# Patient Record
Sex: Male | Born: 1985 | Race: White | Hispanic: No | Marital: Single | Smoking: Current every day smoker
Health system: Southern US, Community
[De-identification: ages and names within clinical notes are randomized; demographics above are authoritative.]

## PROBLEM LIST (undated history)

## (undated) DIAGNOSIS — K8012 Calculus of gallbladder with acute and chronic cholecystitis without obstruction: Secondary | ICD-10-CM

## (undated) HISTORY — DX: Calculus of gallbladder with acute and chronic cholecystitis without obstruction: K80.12

---

## 2018-03-12 ENCOUNTER — Encounter: Payer: Self-pay | Admitting: Emergency Medicine

## 2018-03-12 ENCOUNTER — Other Ambulatory Visit: Payer: Self-pay

## 2018-03-12 ENCOUNTER — Emergency Department
Admission: EM | Admit: 2018-03-12 | Discharge: 2018-03-12 | Disposition: A | Discharge status: Home / Self Care | Payer: Self-pay | Attending: Emergency Medicine | Admitting: Emergency Medicine

## 2018-03-12 ENCOUNTER — Emergency Department: Payer: Self-pay

## 2018-03-12 DIAGNOSIS — F17210 Nicotine dependence, cigarettes, uncomplicated: Secondary | ICD-10-CM | POA: Insufficient documentation

## 2018-03-12 DIAGNOSIS — K529 Noninfective gastroenteritis and colitis, unspecified: Secondary | ICD-10-CM

## 2018-03-12 DIAGNOSIS — K5289 Other specified noninfective gastroenteritis and colitis: Secondary | ICD-10-CM | POA: Insufficient documentation

## 2018-03-12 DIAGNOSIS — Z79899 Other long term (current) drug therapy: Secondary | ICD-10-CM | POA: Insufficient documentation

## 2018-03-12 LAB — COMPREHENSIVE METABOLIC PANEL
ALT: 17 U/L (ref 17–63)
ANION GAP: 9 (ref 5–15)
AST: 25 U/L (ref 15–41)
Albumin: 4.4 g/dL (ref 3.5–5.0)
Alkaline Phosphatase: 53 U/L (ref 38–126)
BUN: 11 mg/dL (ref 6–20)
CHLORIDE: 106 mmol/L (ref 101–111)
CO2: 21 mmol/L — ABNORMAL LOW (ref 22–32)
Calcium: 8.9 mg/dL (ref 8.9–10.3)
Creatinine, Ser: 0.96 mg/dL (ref 0.61–1.24)
GFR calc Af Amer: >60 mL/min (ref >60)
GFR calc non Af Amer: >60 mL/min (ref >60)
Glucose, Bld: 105 mg/dL — ABNORMAL HIGH (ref 65–99)
POTASSIUM: 3.6 mmol/L (ref 3.5–5.1)
Sodium: 136 mmol/L (ref 135–145)
Total Bilirubin: 0.7 mg/dL (ref 0.3–1.2)
Total Protein: 7.3 g/dL (ref 6.5–8.1)

## 2018-03-12 LAB — CBC
HEMATOCRIT: 45.5 % (ref 40.0–52.0)
Hemoglobin: 15.8 g/dL (ref 13.0–18.0)
MCH: 31.6 pg (ref 26.0–34.0)
MCHC: 34.8 g/dL (ref 32.0–36.0)
MCV: 90.9 fL (ref 80.0–100.0)
PLATELETS: 193 10*3/uL (ref 150–440)
RBC: 5.01 MIL/uL (ref 4.40–5.90)
RDW: 12.4 % (ref 11.5–14.5)
WBC: 6.2 10*3/uL (ref 3.8–10.6)

## 2018-03-12 LAB — INFLUENZA PANEL BY PCR (TYPE A & B)
INFLAPCR: NEGATIVE
INFLBPCR: NEGATIVE

## 2018-03-12 LAB — LIPASE, BLOOD: LIPASE: 26 U/L (ref 11–51)

## 2018-03-12 MED ORDER — ONDANSETRON 4 MG PO TBDP: 4.0000 mg | ORAL_TABLET | Freq: Three times a day (TID) | ORAL | 0 refills | Status: DC | PRN

## 2018-03-12 MED ORDER — SODIUM CHLORIDE 0.9 % IV SOLN
1000.0000 mL | Freq: Once | INTRAVENOUS | Status: AC
  Administered 2018-03-12: 1000 mL via INTRAVENOUS

## 2018-03-12 MED ORDER — ONDANSETRON HCL 4 MG/2ML IJ SOLN
4.0000 mg | Freq: Once | INTRAMUSCULAR | Status: AC
  Administered 2018-03-12: 4 mg via INTRAVENOUS
  Filled 2018-03-12: qty 2

## 2018-03-12 NOTE — ED Provider Notes (Signed)
[facility] Emergency Department Provider Note   ____________________________________________    I have reviewed the triage vital signs and the nursing notes.   HISTORY  Chief Complaint Emesis; Cough; and Hematuria     HPI Nicholas Crane is a 32 y.o. male who presents with nausea vomiting and diarrhea as well as abdominal cramps over the last 3-4 days.  Patient reports family has been sick and he has "now caught it ".  Is not sure if he has had any fevers.  Inability to tolerate p.o.'s.  Intermittent diffuse abdominal cramping.  No recent travel.  No camping.  Has not taken anything for this.  Feels dehydrated.   History reviewed. No pertinent past medical history.  There are no active problems to display for this patient.   History reviewed. No pertinent surgical history.  Prior to Admission medications   Medication Sig Start Date End Date Taking? Authorizing Provider  ibuprofen (ADVIL,MOTRIN) 200 MG tablet Take 200-400 mg by mouth every 6 (six) hours as needed.   Yes [provider]  ondansetron (ZOFRAN ODT) 4 MG disintegrating tablet Take 1 tablet (4 mg total) by mouth every 8 (eight) hours as needed for nausea or vomiting. 03/12/18   Jene EveryKinner, Kelley Knoth, MD     Allergies Patient has no known allergies.  No family history on file.  Social History Social History   Tobacco Use  . Smoking status: Current Every Day Smoker    Packs/day: 0.50    Types: Cigarettes  . Smokeless tobacco: Never Used  Substance Use Topics  . Alcohol use: Yes    Comment: occase.  . Drug use: No    Review of Systems  Constitutional: No fever/chills Eyes: No visual changes.  ENT: No sore throat. Cardiovascular: Denies chest pain. Respiratory: Denies shortness of breath.  Positive cough Gastrointestinal: As above Genitourinary: Negative for dysuria. Musculoskeletal: Positive myalgias Skin: Negative for rash. Neurological: Negative for  headaches   ____________________________________________   PHYSICAL EXAM:  VITAL SIGNS: ED Triage Vitals  Enc Vitals Group     BP 03/12/18 0827 118/86     Pulse Rate 03/12/18 0827 68     Resp 03/12/18 0827 18     Temp 03/12/18 0827 99 F (37.2 C)     Temp Source 03/12/18 0827 Oral     SpO2 03/12/18 0845 98 %     Weight 03/12/18 0827 93 kg (205 lb)     Height 03/12/18 0827 1.905 m (6\' 3" )     Head Circumference --      Peak Flow --      Pain Score --      Pain Loc --      Pain Edu? --      Excl. in GC? --     Constitutional: Alert and oriented.  Pleasant and interactive Eyes: Conjunctivae are normal.   Nose: No congestion/rhinnorhea. Mouth/Throat: Mucous membranes are moist.    Cardiovascular: Normal rate, regular rhythm. Grossly normal heart sounds.  Good peripheral circulation. Respiratory: Normal respiratory effort.  No retractions. Lungs CTAB. Gastrointestinal: Soft and nontender. No distention.  No CVA tenderness. Genitourinary: deferred Musculoskeletal:   Warm and well perfused Neurologic:  Normal speech and language. No gross focal neurologic deficits are appreciated.  Skin:  Skin is warm, dry and intact. No rash noted. Psychiatric: Mood and affect are normal. Speech and behavior are normal.  ____________________________________________   LABS (all labs ordered are listed, but only abnormal results are displayed)  Labs Reviewed  COMPREHENSIVE METABOLIC PANEL - Abnormal; Notable for the following components:      Result Value   CO2 21 (*)    Glucose, Bld 105 (*)    All other components within normal limits  CBC  LIPASE, BLOOD  INFLUENZA PANEL BY PCR (TYPE A & B)   ____________________________________________  EKG  None ____________________________________________  RADIOLOGY  Chest x-ray unremarkable ____________________________________________   PROCEDURES  Procedure(s) performed: No  Procedures   Critical Care performed:  No ____________________________________________   INITIAL IMPRESSION / ASSESSMENT AND PLAN / ED COURSE  Pertinent labs & imaging results that were available during my care of the patient were reviewed by me and considered in my medical decision making (see chart for details).  Patient presents with nausea vomiting and diarrhea with myalgias and abdominal cramping.  Strongly suspect viral gastroenteritis.  Will treat with IV fluids, IV Zofran check labs and reevaluate  Lab work is reassuring, influenza negative, chest x-ray negative patient feeling better after IV Zofran,  Successful p.o. Challenge.  Will discharge with Zofran, supportive care, return precautions discussed    ____________________________________________   FINAL CLINICAL IMPRESSION(S) / ED DIAGNOSES  Final diagnoses:  Gastroenteritis        Note:  This document was prepared using Dragon voice recognition software and may include unintentional dictation errors.    Jene Every, MD 03/12/18 [phone]

## 2018-03-12 NOTE — ED Triage Notes (Signed)
Fell down the steps after tripping, states he hit his kidney on the way down, blood in urine for 3 weeks ever since.

## 2018-03-12 NOTE — ED Notes (Signed)
Patient tolerating PO well, MD notified.

## 2018-03-12 NOTE — ED Triage Notes (Signed)
Pt here with c/o vomiting, cough and diarrhea for about 4 days now.

## 2018-05-06 ENCOUNTER — Other Ambulatory Visit: Payer: Self-pay

## 2018-05-06 ENCOUNTER — Emergency Department
Admission: EM | Admit: 2018-05-06 | Discharge: 2018-05-06 | Disposition: A | Discharge status: Home / Self Care | Payer: PRIVATE HEALTH INSURANCE | Attending: Emergency Medicine | Admitting: Emergency Medicine

## 2018-05-06 ENCOUNTER — Encounter: Payer: Self-pay | Admitting: Emergency Medicine

## 2018-05-06 ENCOUNTER — Emergency Department: Payer: PRIVATE HEALTH INSURANCE

## 2018-05-06 DIAGNOSIS — Y939 Activity, unspecified: Secondary | ICD-10-CM | POA: Insufficient documentation

## 2018-05-06 DIAGNOSIS — S60221A Contusion of right hand, initial encounter: Secondary | ICD-10-CM

## 2018-05-06 DIAGNOSIS — Y999 Unspecified external cause status: Secondary | ICD-10-CM | POA: Insufficient documentation

## 2018-05-06 DIAGNOSIS — W228XXA Striking against or struck by other objects, initial encounter: Secondary | ICD-10-CM | POA: Insufficient documentation

## 2018-05-06 DIAGNOSIS — Y929 Unspecified place or not applicable: Secondary | ICD-10-CM | POA: Insufficient documentation

## 2018-05-06 DIAGNOSIS — F17210 Nicotine dependence, cigarettes, uncomplicated: Secondary | ICD-10-CM | POA: Insufficient documentation

## 2018-05-06 MED ORDER — IBUPROFEN 600 MG PO TABS: 600.0000 mg | ORAL_TABLET | Freq: Three times a day (TID) | ORAL | 0 refills | Status: DC | PRN

## 2018-05-06 NOTE — ED Provider Notes (Signed)
[facility] Emergency Department Provider Note   ____________________________________________   First MD Initiated Contact with Patient 05/06/18 0827     (approximate)  I have reviewed the triage vital signs and the nursing notes.   HISTORY  Chief Complaint Hand Injury   HPI Nicholas Crane is a 32 y.o. male is here with complaint of right hand pain.  Patient states that he punched a board since this morning approximately 1 hour prior to arrival.  Patient states that he is fingers are slightly numb.  He is still able to move his digits.  He does have a history of a fractured ulna that was surgically repaired several years ago.  He rates his pain as 10/10.  History reviewed. No pertinent past medical history.  There are no active problems to display for this patient.   History reviewed. No pertinent surgical history.  Prior to Admission medications   Medication Sig Start Date End Date Taking? Authorizing Provider  ibuprofen (ADVIL,MOTRIN) 600 MG tablet Take 1 tablet (600 mg total) by mouth every 8 (eight) hours as needed. 05/06/18   Tommi Rumps, PA-C    Allergies Patient has no known allergies.  No family history on file.  Social History Social History   Tobacco Use  . Smoking status: Current Every Day Smoker    Packs/day: 0.50    Types: Cigarettes  . Smokeless tobacco: Never Used  Substance Use Topics  . Alcohol use: Yes    Comment: occase.  . Drug use: No    Review of Systems Constitutional: No fever/chills Cardiovascular: Denies chest pain. Respiratory: Denies shortness of breath. Musculoskeletal: Positive right hand pain. Skin: Positive for superficial abrasion right hand. Neurological: Negative for headaches, focal weakness.  Numb sensation digits distal to the injury. ___________________________________________   PHYSICAL EXAM:  VITAL SIGNS: ED Triage Vitals  Enc Vitals Group     BP 05/06/18 0811 (!) 152/98   Pulse Rate 05/06/18 0811 87     Resp 05/06/18 0811 16     Temp 05/06/18 0811 98.1 F (36.7 C)     Temp Source 05/06/18 0811 Oral     SpO2 05/06/18 0811 100 %     Weight 05/06/18 0812 205 lb (93 kg)     Height 05/06/18 0812  (1.905 m)     Head Circumference --      Peak Flow --      Pain Score 05/06/18 0812 10     Pain Loc --      Pain Edu? --      Excl. in GC? --    Constitutional: Alert and oriented. Well appearing and in no acute distress. Eyes: Conjunctivae are normal.  Head: Atraumatic. Neck: No stridor.   Cardiovascular: Normal rate, regular rhythm. Grossly normal heart sounds.  Good peripheral circulation. Respiratory: Normal respiratory effort.  No retractions. Lungs CTAB.  Gastrointestinal: Soft and nontender. No distention. No abdominal bruits. No CVA tenderness. Musculoskeletal: Examination of the right hand there is no soft tissue swelling but third, fourth, and fifth metacarpals are tender to touch.  Motor or sensory function intact.  Capillary refill is less than 3 seconds.  Patient is able to flex and extend without any difficulties. Neurologic:  Normal speech and language. No gross focal neurologic deficits are appreciated.  Skin:  Skin is warm, dry and intact. Psychiatric: Mood and affect are normal. Speech and behavior are normal.  ____________________________________________   LABS (all labs ordered are listed, but only abnormal results  are displayed)  Labs Reviewed - No data to display   RADIOLOGY  ED MD interpretation:   Right hand x-ray is negative for fracture.  Official radiology report(s): Dg Hand Complete Right  Result Date: 05/06/2018 CLINICAL DATA:  32 year old male with a history of right hand pain EXAM: RIGHT HAND - COMPLETE 3+ VIEW COMPARISON:  None. FINDINGS: No acute displaced fracture. No focal soft tissue swelling. No radiopaque foreign body. No degenerative changes. Incompletely imaged surgical changes of the right ulna. IMPRESSION:  Negative for acute bony abnormality. Electronically Signed   By: Gilmer Mor D.O.   On: 05/06/2018 09:09    ____________________________________________   PROCEDURES  Procedure(s) performed: Ace wrap by Tiburcio Pea NT  Procedures  Critical Care performed: No  ____________________________________________   INITIAL IMPRESSION / ASSESSMENT AND PLAN / ED COURSE  As part of my medical decision making, I reviewed the following data within the electronic MEDICAL RECORD NUMBER Notes from prior ED visits and [place] Controlled Substance Database  Patient was made aware that x-ray was negative for fracture.  He was placed in an Ace wrap for padding protection.  Patient is encouraged to ice and elevate as needed for swelling.  He is to follow-up with [facility] acute care if any continued problems and if any worsening of his symptoms follow-up with Dr. Joice Lofts.  ____________________________________________   FINAL CLINICAL IMPRESSION(S) / ED DIAGNOSES  Final diagnoses:  Contusion of right hand, initial encounter     ED Discharge Orders        Ordered    ibuprofen (ADVIL,MOTRIN) 600 MG tablet  Every 8 hours PRN     05/06/18 0924       Note:  This document was prepared using Dragon voice recognition software and may include unintentional dictation errors.    Tommi Rumps, PA-C 05/06/18 1129    Nita Sickle, MD 05/07/18 0730

## 2018-05-06 NOTE — ED Triage Notes (Signed)
Patient complaining of right hand pain, states he punched a board fence while upset approximately one hour ago.  Abrasions noted over knuckles.  Ice pack applied.  Can move fingers, can feel touch.  States his thumb "is completely numb".  Hx of "rod in my ulna at my wrist".

## 2018-05-06 NOTE — Discharge Instructions (Addendum)
Ice and elevate as needed for pain or swelling.  Wear Ace wrap for support and protection.  Ibuprofen 3 times daily with food for inflammation and pain.  Follow-up with [facility] acute care if any continued problems and make an appointment with Dr. Joice Lofts who is the orthopedist on call if you continue to have problems with your hand.

## 2018-06-22 ENCOUNTER — Observation Stay
Admission: EM | Admit: 2018-06-22 | Discharge: 2018-06-24 | Disposition: A | Discharge status: Home / Self Care | Payer: Self-pay | Attending: Surgery | Admitting: Surgery

## 2018-06-22 ENCOUNTER — Emergency Department: Payer: Self-pay

## 2018-06-22 ENCOUNTER — Other Ambulatory Visit: Payer: Self-pay

## 2018-06-22 DIAGNOSIS — F17210 Nicotine dependence, cigarettes, uncomplicated: Secondary | ICD-10-CM | POA: Insufficient documentation

## 2018-06-22 DIAGNOSIS — K8000 Calculus of gallbladder with acute cholecystitis without obstruction: Secondary | ICD-10-CM | POA: Diagnosis present

## 2018-06-22 DIAGNOSIS — K8050 Calculus of bile duct without cholangitis or cholecystitis without obstruction: Secondary | ICD-10-CM

## 2018-06-22 DIAGNOSIS — K810 Acute cholecystitis: Secondary | ICD-10-CM

## 2018-06-22 DIAGNOSIS — K8012 Calculus of gallbladder with acute and chronic cholecystitis without obstruction: Principal | ICD-10-CM | POA: Insufficient documentation

## 2018-06-22 LAB — CBC
HCT: 44.2 % (ref 40.0–52.0)
Hemoglobin: 15.4 g/dL (ref 13.0–18.0)
MCH: 32.3 pg (ref 26.0–34.0)
MCHC: 34.9 g/dL (ref 32.0–36.0)
MCV: 92.4 fL (ref 80.0–100.0)
PLATELETS: 220 10*3/uL (ref 150–440)
RBC: 4.78 MIL/uL (ref 4.40–5.90)
RDW: 12.8 % (ref 11.5–14.5)
WBC: 7.4 10*3/uL (ref 3.8–10.6)

## 2018-06-22 LAB — COMPREHENSIVE METABOLIC PANEL
ALT: 24 U/L (ref 0–44)
AST: 22 U/L (ref 15–41)
Albumin: 4.2 g/dL (ref 3.5–5.0)
Alkaline Phosphatase: 49 U/L (ref 38–126)
Anion gap: 7 (ref 5–15)
BUN: 14 mg/dL (ref 6–20)
CHLORIDE: 108 mmol/L (ref 98–111)
CO2: 24 mmol/L (ref 22–32)
CREATININE: 0.87 mg/dL (ref 0.61–1.24)
Calcium: 9 mg/dL (ref 8.9–10.3)
GFR calc Af Amer: >60 mL/min (ref >60)
GLUCOSE: 89 mg/dL (ref 70–99)
Potassium: 3.7 mmol/L (ref 3.5–5.1)
Sodium: 139 mmol/L (ref 135–145)
Total Bilirubin: 0.4 mg/dL (ref 0.3–1.2)
Total Protein: 6.8 g/dL (ref 6.5–8.1)

## 2018-06-22 LAB — LIPASE, BLOOD: LIPASE: 27 U/L (ref 11–51)

## 2018-06-22 LAB — URINALYSIS, COMPLETE (UACMP) WITH MICROSCOPIC
Bacteria, UA: NONE SEEN
Bilirubin Urine: NEGATIVE
GLUCOSE, UA: NEGATIVE mg/dL
Ketones, ur: NEGATIVE mg/dL
Leukocytes, UA: NEGATIVE
Nitrite: NEGATIVE
PH: 8 (ref 5.0–8.0)
Protein, ur: NEGATIVE mg/dL
SPECIFIC GRAVITY, URINE: 1.005 (ref 1.005–1.030)
SQUAMOUS EPITHELIAL / LPF: NONE SEEN (ref 0–5)

## 2018-06-22 MED ORDER — FENTANYL CITRATE (PF) 100 MCG/2ML IJ SOLN
50.0000 ug | Freq: Once | INTRAMUSCULAR | Status: AC
  Administered 2018-06-22: 50 ug via INTRAVENOUS
  Filled 2018-06-22: qty 2

## 2018-06-22 MED ORDER — SODIUM CHLORIDE 0.9 % IV SOLN
1000.0000 mL | Freq: Once | INTRAVENOUS | Status: AC
  Administered 2018-06-22: 1000 mL via INTRAVENOUS

## 2018-06-22 MED ORDER — KETOROLAC TROMETHAMINE 30 MG/ML IJ SOLN
30.0000 mg | Freq: Once | INTRAMUSCULAR | Status: AC
  Administered 2018-06-22: 30 mg via INTRAVENOUS
  Filled 2018-06-22: qty 1

## 2018-06-22 MED ORDER — ONDANSETRON HCL 4 MG/2ML IJ SOLN
4.0000 mg | Freq: Once | INTRAMUSCULAR | Status: AC
  Administered 2018-06-22: 4 mg via INTRAVENOUS
  Filled 2018-06-22: qty 2

## 2018-06-22 MED ORDER — ALUM & MAG HYDROXIDE-SIMETH 200-200-20 MG/5ML PO SUSP
30.0000 mL | Freq: Once | ORAL | Status: AC
  Administered 2018-06-22: 30 mL via ORAL
  Filled 2018-06-22: qty 30

## 2018-06-22 MED ORDER — FENTANYL CITRATE (PF) 100 MCG/2ML IJ SOLN
50.0000 ug | INTRAMUSCULAR | Status: DC | PRN
  Administered 2018-06-22 – 2018-06-23 (×5): 50 ug via INTRAVENOUS
  Filled 2018-06-22 (×3): qty 2

## 2018-06-22 MED ORDER — KCL IN DEXTROSE-NACL 20-5-0.45 MEQ/L-%-% IV SOLN
INTRAVENOUS | Status: DC
  Administered 2018-06-22 – 2018-06-23 (×2): via INTRAVENOUS
  Filled 2018-06-22 (×7): qty 1000

## 2018-06-22 MED ORDER — HEPARIN SODIUM (PORCINE) 5000 UNIT/ML IJ SOLN
5000.0000 [IU] | Freq: Three times a day (TID) | INTRAMUSCULAR | Status: DC
  Administered 2018-06-23 (×2): 5000 [IU] via SUBCUTANEOUS
  Filled 2018-06-22 (×2): qty 1

## 2018-06-22 MED ORDER — PANTOPRAZOLE SODIUM 40 MG PO TBEC: 40.0000 mg | DELAYED_RELEASE_TABLET | Freq: Every day | ORAL | Status: DC

## 2018-06-22 MED ORDER — HYDROMORPHONE HCL 1 MG/ML IJ SOLN
INTRAMUSCULAR | Status: AC
  Administered 2018-06-22: 1 mg via INTRAVENOUS
  Filled 2018-06-22: qty 1

## 2018-06-22 MED ORDER — KETOROLAC TROMETHAMINE 30 MG/ML IJ SOLN
30.0000 mg | Freq: Four times a day (QID) | INTRAMUSCULAR | Status: AC
  Administered 2018-06-22: 30 mg via INTRAVENOUS
  Filled 2018-06-22: qty 1

## 2018-06-22 MED ORDER — ONDANSETRON 4 MG PO TBDP: 4.0000 mg | ORAL_TABLET | Freq: Four times a day (QID) | ORAL | Status: DC | PRN

## 2018-06-22 MED ORDER — PANTOPRAZOLE SODIUM 40 MG PO TBEC
40.0000 mg | DELAYED_RELEASE_TABLET | Freq: Once | ORAL | Status: AC
  Administered 2018-06-22: 40 mg via ORAL
  Filled 2018-06-22: qty 1

## 2018-06-22 MED ORDER — ACETAMINOPHEN 500 MG PO TABS
1000.0000 mg | ORAL_TABLET | Freq: Four times a day (QID) | ORAL | Status: DC
  Administered 2018-06-22: 1000 mg via ORAL
  Filled 2018-06-22 (×2): qty 2

## 2018-06-22 MED ORDER — ONDANSETRON HCL 4 MG/2ML IJ SOLN
INTRAMUSCULAR | Status: AC
  Administered 2018-06-22: 4 mg via INTRAVENOUS
  Filled 2018-06-22: qty 2

## 2018-06-22 MED ORDER — HYDROMORPHONE HCL 1 MG/ML IJ SOLN
1.0000 mg | INTRAMUSCULAR | Status: AC
  Administered 2018-06-22 (×2): 1 mg via INTRAVENOUS

## 2018-06-22 MED ORDER — HYDROMORPHONE HCL 1 MG/ML IJ SOLN
1.0000 mg | Freq: Once | INTRAMUSCULAR | Status: AC
  Administered 2018-06-22: 1 mg via INTRAVENOUS
  Filled 2018-06-22: qty 1

## 2018-06-22 MED ORDER — MORPHINE SULFATE (PF) 4 MG/ML IV SOLN
4.0000 mg | Freq: Once | INTRAVENOUS | Status: AC
  Administered 2018-06-22: 4 mg via INTRAVENOUS
  Filled 2018-06-22: qty 1

## 2018-06-22 MED ORDER — ONDANSETRON HCL 4 MG/2ML IJ SOLN
4.0000 mg | Freq: Four times a day (QID) | INTRAMUSCULAR | Status: DC | PRN
  Administered 2018-06-23: 4 mg via INTRAVENOUS

## 2018-06-22 MED ORDER — ONDANSETRON HCL 4 MG/2ML IJ SOLN
4.0000 mg | Freq: Once | INTRAMUSCULAR | Status: AC
  Administered 2018-06-22: 4 mg via INTRAVENOUS

## 2018-06-22 NOTE — H&P (Signed)
SURGICAL ADMISSION HISTORY AND PHYSICAL (cpt: [phone])  HISTORY OF PRESENT ILLNESS (HPI):  32 y.o. male presented to [facility] ED today for evaluation of abdominal pain. Patient reports he's been experiencing RUQ to Right flank abdominal pain x 1.5 years, beginning once per month with increasing frequency to weekly and more recently daily. Patient states his pain is worse with laying down at night with frequent belching, bilious emesis, and worse following "any" foods or even water (not only fatty foods). Patient adds that he used to take Prilosec and Tums, but he stopped both medications after choosing to eat healthier and says his RUQ abdominal pain is different than his prior reflux, which he denies having experienced since altering his diet. While in the ED today, patient initially received multiple doses of Dilaudid per ED, but his pain persisted and improved very little even after administration of Toradol, fentanyl, protonix, and Maalox. Patient otherwise denies any fever/chills, CP, or SOB.  Surgery is consulted by ED physician Dr. Mayford KnifeWilliams in this context for evaluation and management of acute calculous cholecystitis.  PAST MEDICAL HISTORY (PMH):  History reviewed. No pertinent past medical history.   PAST SURGICAL HISTORY (PSH):  History reviewed. No pertinent surgical history.   MEDICATIONS:  Prior to Admission medications   Medication Sig Start Date End Date Taking? Authorizing Provider  ibuprofen (ADVIL,MOTRIN) 600 MG tablet Take 1 tablet (600 mg total) by mouth every 8 (eight) hours as needed. Patient not taking: Reported on 06/22/2018 05/06/18   Tommi RumpsSummers, Rhonda L, PA-C     ALLERGIES:  No Known Allergies   SOCIAL HISTORY:  Social History   Socioeconomic History  . Marital status: Single    Spouse name: Not on file  . Number of children: Not on file  . Years of education: Not on file  . Highest education level: Not on file  Occupational History  . Not on file  Social Needs  .  Financial resource strain: Not on file  . Food insecurity:    Worry: Not on file    Inability: Not on file  . Transportation needs:    Medical: Not on file    Non-medical: Not on file  Tobacco Use  . Smoking status: Current Every Day Smoker    Packs/day: 0.50    Types: Cigarettes  . Smokeless tobacco: Never Used  Substance and Sexual Activity  . Alcohol use: Yes    Comment: occase.  . Drug use: No  . Sexual activity: Not on file  Lifestyle  . Physical activity:    Days per week: Not on file    Minutes per session: Not on file  . Stress: Not on file  Relationships  . Social connections:    Talks on phone: Not on file    Gets together: Not on file    Attends religious service: Not on file    Active member of club or organization: Not on file    Attends meetings of clubs or organizations: Not on file    Relationship status: Not on file  . Intimate partner violence:    Fear of current or ex partner: Not on file    Emotionally abused: Not on file    Physically abused: Not on file    Forced sexual activity: Not on file  Other Topics Concern  . Not on file  Social History Narrative  . Not on file    The patient currently resides (home / rehab facility / nursing home): Home The patient normally is (  ambulatory / bedbound): Ambulatory   FAMILY HISTORY:  No family history on file.   REVIEW OF SYSTEMS:  Constitutional: denies weight loss, fever, chills, or sweats  Eyes: denies any other vision changes, history of eye injury  ENT: denies sore throat, hearing problems  Respiratory: denies shortness of breath, wheezing  Cardiovascular: denies chest pain, palpitations  Gastrointestinal: abdominal pain, N/V, and bowel function as per HPI Genitourinary: denies burning with urination or urinary frequency Musculoskeletal: denies any other joint pains or cramps  Skin: denies any other rashes or skin discolorations  Neurological: denies any other headache, dizziness, weakness   Psychiatric: denies any other depression, anxiety   All other review of systems were negative   VITAL SIGNS:  Temp:  [98.6 F (37 C)] 98.6 F (37 C) (06/25 0712) Pulse Rate:  [51-98] 67 (06/25 1415) Resp:  [9-24] 21 (06/25 1500) BP: (104-142)/(62-123) 115/83 (06/25 1500) SpO2:  [96 %-100 %] 96 % (06/25 1415) Weight:  [205 lb (93 kg)] 205 lb (93 kg) (06/25 0713)     Height: 6\' 3"  (190.5 cm) Weight: 205 lb (93 kg) BMI (Calculated): 25.62   INTAKE/OUTPUT:  This shift: Total I/O In: 1000 [I.V.:1000] Out: -   Last 2 shifts: @IOLAST2SHIFTS @   PHYSICAL EXAM:  Constitutional:  -- Normal body habitus  -- Awake, alert, and oriented x3, no apparent distress Eyes:  -- Pupils equally round and reactive to light  -- No scleral icterus, B/L no occular discharge Ear, nose, throat: -- Neck is FROM WNL -- No jugular venous distension  Pulmonary:  -- No wheezes or rhales -- Equal breath sounds bilaterally -- Breathing non-labored at rest Cardiovascular:  -- S1, S2 present  -- No pericardial rubs  Gastrointestinal:  -- Abdomen soft and non-distended with focal RUQ abdominal tenderness to palpation, no guarding or rebound tenderness -- No abdominal masses appreciated, pulsatile or otherwise  Musculoskeletal and Integumentary:  -- Wounds or skin discoloration: None appreciated -- Extremities: B/L UE and LE FROM, hands and feet warm, no edema  Neurologic:  -- Motor function: Intact and symmetric -- Sensation: Intact and symmetric Psychiatric:  -- Mood and affect WNL  Labs:  CBC Latest Ref Rng & Units 06/22/2018 03/12/2018  WBC 3.8 - 10.6 K/uL 7.4 6.2  Hemoglobin 13.0 - 18.0 g/dL 16.1 09.6  Hematocrit 04.5 - 52.0 % 44.2 45.5  Platelets 150 - 440 K/uL 220 193   CMP Latest Ref Rng & Units 06/22/2018 03/12/2018  Glucose 70 - 99 mg/dL 89 409(W)  BUN 6 - 20 mg/dL 14 11  Creatinine 1.19 - 1.24 mg/dL 1.47 8.29  Sodium 562 - 145 mmol/L 139 136  Potassium 3.5 - 5.1 mmol/L 3.7 3.6  Chloride  98 - 111 mmol/L 108 106  CO2 22 - 32 mmol/L 24 21(L)  Calcium 8.9 - 10.3 mg/dL 9.0 8.9  Total Protein 6.5 - 8.1 g/dL 6.8 7.3  Total Bilirubin 0.3 - 1.2 mg/dL 0.4 0.7  Alkaline Phos 38 - 126 U/L 49 53  AST 15 - 41 U/L 22 25  ALT 0 - 44 U/L 24 17   Imaging studies:  CT Abdomen and Pelvis without Contrast (06/22/2018) - personally reviewed and discussed with patient and ED physician 1. Cholelithiasis with suspected mild gallbladder wall thickening. Right upper quadrant ultrasound examination may be helpful for further evaluation. 2. No intra or extrahepatic biliary dilatation. 3. Right renal calculus but no obstructing ureteral calculi or bladder calculi.  Limited RUQ Abdominal Ultrasound (06/22/2018) Cholelithiasis. The gallbladder is tender  but there is no wall thickening or edema typical of acute cholecystitis.  Assessment/Plan: (ICD-10's: K81.0) 32 y.o. male with acute cholecystitis, complicated by GERD and chronic ongoing tobacco abuse (smoking).   - Toradol, fentanyl, protonix, and Maalox advised  - because pain not able to be controlled and concern for cholecystitis, will admit to surgical service             - all risks, benefits, and alternatives to cholecystectomy were discussed with the patient, all of his questions were answered to his expressed satisfaction, including the possibility that his symptoms could be due to his gallbladder, GERD/gastritis, or most likely both, patient expresses he wishes to proceed, and informed consent was obtained.             - NPO for laparoscopic cholecystectomy tomorrow pending anesthesia and OR availability             - anticipate return to clinic 2 weeks after above planned surgery             - will advise to continue once daily PPI, not prn  All of the above findings and recommendations were discussed with the patient, and all of patient's questions were answered to his expressed satisfaction.  -- Scherrie Gerlach Earlene Plater, MD, RPVI [place]:  [facility] General Surgery - Partnering for exceptional care. Office: [phone]

## 2018-06-22 NOTE — ED Triage Notes (Signed)
Right sided abdominal pain, right sided flank pain. Hematuria this AM. Emesis. Pain has been there for 2 days, increased in intensity today.

## 2018-06-22 NOTE — ED Provider Notes (Signed)
[facility] Emergency Department Provider Note       Time seen: ----------------------------------------- 7:51 AM on 06/22/2018 -----------------------------------------   I have reviewed the triage vital signs and the nursing notes.  HISTORY   Chief Complaint Flank Pain; Hematuria; and Abdominal Pain   HPI Nicholas Crane is a 32 y.o. male with a history of gallstones who presents to the ED for right-sided abdominal pain and right-sided flank pain.  Patient also had some hematuria this morning.  Patient reports pain is been there for 2 days and is increased intensity today.  He has had vomiting that appears to be bile he states.  He denies fevers, chills or other complaints.  History reviewed. No pertinent past medical history.  There are no active problems to display for this patient.   History reviewed. No pertinent surgical history.  Allergies Patient has no known allergies.  Social History Social History   Tobacco Use  . Smoking status: Current Every Day Smoker    Packs/day: 0.50    Types: Cigarettes  . Smokeless tobacco: Never Used  Substance Use Topics  . Alcohol use: Yes    Comment: occase.  . Drug use: No   Review of Systems Constitutional: Negative for fever. Cardiovascular: Negative for chest pain. Respiratory: Negative for shortness of breath. Gastrointestinal: Positive for flank pain, vomiting Genitourinary: Positive for hematuria Musculoskeletal: Negative for back pain. Skin: Negative for rash. Neurological: Negative for headaches, focal weakness or numbness.  All systems negative/normal/unremarkable except as stated in the HPI  ____________________________________________   PHYSICAL EXAM:  VITAL SIGNS: ED Triage Vitals  Enc Vitals Group     BP 06/22/18 0712 (!) 140/101     Pulse Rate 06/22/18 0712 88     Resp 06/22/18 0712 16     Temp 06/22/18 0712 98.6 F (37 C)     Temp Source 06/22/18 0712 Oral     SpO2  06/22/18 0712 100 %     Weight 06/22/18 0713 205 lb (93 kg)     Height 06/22/18 0713 6\' 3"  (1.905 m)     Head Circumference --      Peak Flow --      Pain Score 06/22/18 0712 10     Pain Loc --      Pain Edu? --      Excl. in GC? --    Constitutional: Alert and oriented.  Mild to moderate distress Eyes: Conjunctivae are normal. Normal extraocular movements. ENT   Head: Normocephalic and atraumatic.   Nose: No congestion/rhinnorhea.   Mouth/Throat: Mucous membranes are moist.   Neck: No stridor. Cardiovascular: Normal rate, regular rhythm. No murmurs, rubs, or gallops. Respiratory: Normal respiratory effort without tachypnea nor retractions. Breath sounds are clear and equal bilaterally. No wheezes/rales/rhonchi. Gastrointestinal: Right flank and right upper quadrant tenderness.  Normal bowel sounds. Musculoskeletal: Nontender with normal range of motion in extremities. No lower extremity tenderness nor edema. Neurologic:  Normal speech and language. No gross focal neurologic deficits are appreciated.  Skin:  Skin is warm, dry and intact. No rash noted. Psychiatric: Mood and affect are normal. Speech and behavior are normal.  ____________________________________________  ED COURSE:  As part of my medical decision making, I reviewed the following data within the electronic MEDICAL RECORD NUMBER History obtained from family if available, nursing notes, old chart and ekg, as well as notes from prior ED visits. Patient presented for flank pain, we will assess with labs and imaging as indicated at this time.  Procedures ____________________________________________   LABS (pertinent positives/negatives)  Labs Reviewed  URINALYSIS, COMPLETE (UACMP) WITH MICROSCOPIC - Abnormal; Notable for the following components:      Result Value   Color, Urine STRAW (*)    APPearance CLEAR (*)    Hgb urine dipstick MODERATE (*)    All other components within normal limits  LIPASE, BLOOD   COMPREHENSIVE METABOLIC PANEL  CBC    RADIOLOGY Images were viewed by me  CT renal protocol IMPRESSION: 1. Cholelithiasis with suspected mild gallbladder wall thickening. Right upper quadrant ultrasound examination may be helpful for further evaluation. 2. No intra or extrahepatic biliary dilatation. 3. Right renal calculus but no obstructing ureteral calculi or bladder calculi. [place] RUQ IMPRESSION: Cholelithiasis. The gallbladder is tender but there is no wall thickening or edema typical of acute cholecystitis.  ____________________________________________  DIFFERENTIAL DIAGNOSIS   Renal colic, UTI, biliary colic, appendicitis  FINAL ASSESSMENT AND PLAN  Biliary colic  Plan: The patient had presented for flank pain and vomiting. Patient's labs are surprisingly normal. Patient's imaging did reveal biliary colic both by CT and ultrasound.  His pain seems to be intractable despite morphine, fluids, Toradol, Zofran and repeat doses of Dilaudid.  I have discussed with general surgery for possible cholecystectomy.   [name], MD   Note: This note was generated in part or whole with voice recognition software. Voice recognition is usually quite accurate but there are transcription errors that can and very often do occur. I apologize for any typographical errors that were not detected and corrected.     [name], MD 06/22/18 1009

## 2018-06-22 NOTE — ED Triage Notes (Signed)
First RN:  Right side abdominal pain and vomiting bile.  Patient is hunched over--had patient sit in wheelchair.

## 2018-06-22 NOTE — ED Notes (Signed)
Per RN Cassie MD Earlene Plateravis Surgeon would be back to later assess patient again after some pt rounds.

## 2018-06-22 NOTE — Consult Note (Signed)
SURGICAL CONSULTATION NOTE (initial)  HISTORY OF PRESENT ILLNESS (HPI):  32 y.o. male presented to [facility] ED today for evaluation of abdominal pain. Patient reports he's been experiencing RUQ to Right flank abdominal pain x 1.5 years, beginning once per month with increasing frequency to weekly and more recently daily. Patient states his pain is worse with laying down at night with frequent belching, bilious emesis, and worse following "any" foods or even water (not only fatty foods). Patient adds that he used to take Prilosec and Tums, but he stopped both medications after choosing to eat healthier and says his RUQ abdominal pain is different than his prior reflux, which he denies having experienced since altering his diet. While in the ED today, patient initially received multiple doses of Dilaudid per ED, but his pain persisted and improved very little even after administration of Toradol, fentanyl, protonix, and Maalox. Patient otherwise denies any fever/chills, CP, or SOB.  Surgery is consulted by ED physician Dr. Mayford KnifeWilliams in this context for evaluation and management of acute calculous cholecystitis.  PAST MEDICAL HISTORY (PMH):  History reviewed. No pertinent past medical history.   PAST SURGICAL HISTORY (PSH):  History reviewed. No pertinent surgical history.   MEDICATIONS:  Prior to Admission medications   Medication Sig Start Date End Date Taking? Authorizing Provider  ibuprofen (ADVIL,MOTRIN) 600 MG tablet Take 1 tablet (600 mg total) by mouth every 8 (eight) hours as needed. Patient not taking: Reported on 06/22/2018 05/06/18   Tommi RumpsSummers, Rhonda L, PA-C     ALLERGIES:  No Known Allergies   SOCIAL HISTORY:  Social History   Socioeconomic History  . Marital status: Single    Spouse name: Not on file  . Number of children: Not on file  . Years of education: Not on file  . Highest education level: Not on file  Occupational History  . Not on file  Social Needs  . Financial  resource strain: Not on file  . Food insecurity:    Worry: Not on file    Inability: Not on file  . Transportation needs:    Medical: Not on file    Non-medical: Not on file  Tobacco Use  . Smoking status: Current Every Day Smoker    Packs/day: 0.50    Types: Cigarettes  . Smokeless tobacco: Never Used  Substance and Sexual Activity  . Alcohol use: Yes    Comment: occase.  . Drug use: No  . Sexual activity: Not on file  Lifestyle  . Physical activity:    Days per week: Not on file    Minutes per session: Not on file  . Stress: Not on file  Relationships  . Social connections:    Talks on phone: Not on file    Gets together: Not on file    Attends religious service: Not on file    Active member of club or organization: Not on file    Attends meetings of clubs or organizations: Not on file    Relationship status: Not on file  . Intimate partner violence:    Fear of current or ex partner: Not on file    Emotionally abused: Not on file    Physically abused: Not on file    Forced sexual activity: Not on file  Other Topics Concern  . Not on file  Social History Narrative  . Not on file    The patient currently resides (home / rehab facility / nursing home): Home The patient normally is (ambulatory / bedbound):  Ambulatory   FAMILY HISTORY:  No family history on file.   REVIEW OF SYSTEMS:  Constitutional: denies weight loss, fever, chills, or sweats  Eyes: denies any other vision changes, history of eye injury  ENT: denies sore throat, hearing problems  Respiratory: denies shortness of breath, wheezing  Cardiovascular: denies chest pain, palpitations  Gastrointestinal: abdominal pain, N/V, and bowel function as per HPI Genitourinary: denies burning with urination or urinary frequency Musculoskeletal: denies any other joint pains or cramps  Skin: denies any other rashes or skin discolorations  Neurological: denies any other headache, dizziness, weakness  Psychiatric:  denies any other depression, anxiety   All other review of systems were negative   VITAL SIGNS:  Temp:  [98.6 F (37 C)] 98.6 F (37 C) (06/25 0712) Pulse Rate:  [51-98] 67 (06/25 1415) Resp:  [9-24] 21 (06/25 1500) BP: (104-142)/(62-123) 115/83 (06/25 1500) SpO2:  [96 %-100 %] 96 % (06/25 1415) Weight:  [205 lb (93 kg)] 205 lb (93 kg) (06/25 0713)     Height: 6\' 3"  (190.5 cm) Weight: 205 lb (93 kg) BMI (Calculated): 25.62   INTAKE/OUTPUT:  This shift: Total I/O In: 1000 [I.V.:1000] Out: -   Last 2 shifts: @IOLAST2SHIFTS @   PHYSICAL EXAM:  Constitutional:  -- Normal body habitus  -- Awake, alert, and oriented x3, no apparent distress Eyes:  -- Pupils equally round and reactive to light  -- No scleral icterus, B/L no occular discharge Ear, nose, throat: -- Neck is FROM WNL -- No jugular venous distension  Pulmonary:  -- No wheezes or rhales -- Equal breath sounds bilaterally -- Breathing non-labored at rest Cardiovascular:  -- S1, S2 present  -- No pericardial rubs  Gastrointestinal:  -- Abdomen soft and non-distended with focal RUQ abdominal tenderness to palpation, no guarding or rebound tenderness -- No abdominal masses appreciated, pulsatile or otherwise  Musculoskeletal and Integumentary:  -- Wounds or skin discoloration: None appreciated -- Extremities: B/L UE and LE FROM, hands and feet warm, no edema  Neurologic:  -- Motor function: Intact and symmetric -- Sensation: Intact and symmetric Psychiatric:  -- Mood and affect WNL  Labs:  CBC Latest Ref Rng & Units 06/22/2018 03/12/2018  WBC 3.8 - 10.6 K/uL 7.4 6.2  Hemoglobin 13.0 - 18.0 g/dL 16.1 09.6  Hematocrit 04.5 - 52.0 % 44.2 45.5  Platelets 150 - 440 K/uL 220 193   CMP Latest Ref Rng & Units 06/22/2018 03/12/2018  Glucose 70 - 99 mg/dL 89 409(W)  BUN 6 - 20 mg/dL 14 11  Creatinine 1.19 - 1.24 mg/dL 1.47 8.29  Sodium 562 - 145 mmol/L 139 136  Potassium 3.5 - 5.1 mmol/L 3.7 3.6  Chloride 98 - 111  mmol/L 108 106  CO2 22 - 32 mmol/L 24 21(L)  Calcium 8.9 - 10.3 mg/dL 9.0 8.9  Total Protein 6.5 - 8.1 g/dL 6.8 7.3  Total Bilirubin 0.3 - 1.2 mg/dL 0.4 0.7  Alkaline Phos 38 - 126 U/L 49 53  AST 15 - 41 U/L 22 25  ALT 0 - 44 U/L 24 17   Imaging studies:  CT Abdomen and Pelvis without Contrast (06/22/2018) - personally reviewed and discussed with patient and ED physician 1. Cholelithiasis with suspected mild gallbladder wall thickening. Right upper quadrant ultrasound examination may be helpful for further evaluation. 2. No intra or extrahepatic biliary dilatation. 3. Right renal calculus but no obstructing ureteral calculi or bladder calculi.  Limited RUQ Abdominal Ultrasound (06/22/2018) Cholelithiasis. The gallbladder is tender but there is  no wall thickening or edema typical of acute cholecystitis.  Assessment/Plan: (ICD-10's: K81.0) 32 y.o. male with acute cholecystitis, complicated by GERD.   - Toradol, fentanyl, protonix, and Maalox advised  - because pain not able to be controlled and concern for cholecystitis, will admit to surgical service             - all risks, benefits, and alternatives to cholecystectomy were discussed with the patient, all of his questions were answered to his expressed satisfaction, including the possibility that his symptoms could be due to his gallbladder, GERD/gastritis, or most likely both, patient expresses he wishes to proceed, and informed consent was obtained.             - NPO for laparoscopic cholecystectomy tomorrow pending anesthesia and OR availability             - anticipate return to clinic 2 weeks after above planned surgery             - will advise to continue once daily PPI, not prn  All of the above findings and recommendations were discussed with the patient, and all of patient's questions were answered to his expressed satisfaction.  Thank you for the opportunity to participate in this patient's care.   -- Scherrie Gerlach Earlene Plater, MD,  R[facility] [place]: [facility] General Surgery - Partnering for exceptional care. Office: [phone]

## 2018-06-22 NOTE — ED Notes (Signed)
Surgeon at bedside.  

## 2018-06-22 NOTE — ED Notes (Signed)
This RN received verbal order for dilaudid 1 mg IV for pt, at this time. RN administered it.   Pt was inquiring on surgeon, EDP is aware and states surgeon in surgery will update accordingly.

## 2018-06-22 NOTE — ED Notes (Signed)
This RN and Marketing executiveurgeon Davis at bedside. PT requesting Ice chips Nicholas Plateravis MD explained NPO as the possible need for surgery. Pt is still requesting ice chips Nicholas Plateravis MD explained that it would prolong surgery if it came to pass that he needed to have surgery. Pt resting and has family at bedside.

## 2018-06-23 ENCOUNTER — Encounter: Payer: Self-pay | Admitting: *Deleted

## 2018-06-23 ENCOUNTER — Encounter
Admission: EM | Disposition: A | Discharge status: Home / Self Care | Payer: Self-pay | Source: Home / Self Care | Attending: Emergency Medicine

## 2018-06-23 ENCOUNTER — Observation Stay: Payer: Self-pay | Admitting: Registered Nurse

## 2018-06-23 DIAGNOSIS — K8012 Calculus of gallbladder with acute and chronic cholecystitis without obstruction: Principal | ICD-10-CM

## 2018-06-23 HISTORY — PX: CHOLECYSTECTOMY: SHX55

## 2018-06-23 LAB — MRSA PCR SCREENING: MRSA by PCR: NEGATIVE

## 2018-06-23 LAB — COMPREHENSIVE METABOLIC PANEL
ALK PHOS: 37 U/L — AB (ref 38–126)
ALT: 18 U/L (ref 0–44)
AST: 16 U/L (ref 15–41)
Albumin: 3.6 g/dL (ref 3.5–5.0)
Anion gap: 6 (ref 5–15)
BUN: 15 mg/dL (ref 6–20)
CALCIUM: 8.5 mg/dL — AB (ref 8.9–10.3)
CO2: 26 mmol/L (ref 22–32)
CREATININE: 0.91 mg/dL (ref 0.61–1.24)
Chloride: 106 mmol/L (ref 98–111)
Glucose, Bld: 102 mg/dL — ABNORMAL HIGH (ref 70–99)
Potassium: 3.7 mmol/L (ref 3.5–5.1)
Sodium: 138 mmol/L (ref 135–145)
Total Bilirubin: 0.6 mg/dL (ref 0.3–1.2)
Total Protein: 5.7 g/dL — ABNORMAL LOW (ref 6.5–8.1)

## 2018-06-23 LAB — CBC
HCT: 41 % (ref 40.0–52.0)
Hemoglobin: 14.2 g/dL (ref 13.0–18.0)
MCH: 32.5 pg (ref 26.0–34.0)
MCHC: 34.7 g/dL (ref 32.0–36.0)
MCV: 93.6 fL (ref 80.0–100.0)
PLATELETS: 183 10*3/uL (ref 150–440)
RBC: 4.38 MIL/uL — AB (ref 4.40–5.90)
RDW: 12.6 % (ref 11.5–14.5)
WBC: 6.7 10*3/uL (ref 3.8–10.6)

## 2018-06-23 SURGERY — LAPAROSCOPIC CHOLECYSTECTOMY
Anesthesia: General

## 2018-06-23 MED ORDER — KETOROLAC TROMETHAMINE 30 MG/ML IJ SOLN
30.0000 mg | Freq: Four times a day (QID) | INTRAMUSCULAR | Status: DC
  Administered 2018-06-23 – 2018-06-24 (×3): 30 mg via INTRAVENOUS
  Filled 2018-06-23 (×3): qty 1

## 2018-06-23 MED ORDER — MORPHINE SULFATE (PF) 2 MG/ML IV SOLN
2.0000 mg | INTRAVENOUS | Status: DC | PRN
  Administered 2018-06-23 – 2018-06-24 (×3): 2 mg via INTRAVENOUS
  Filled 2018-06-23 (×3): qty 1

## 2018-06-23 MED ORDER — DEXAMETHASONE SODIUM PHOSPHATE 10 MG/ML IJ SOLN
INTRAMUSCULAR | Status: AC
  Filled 2018-06-23: qty 1

## 2018-06-23 MED ORDER — CHLORHEXIDINE GLUCONATE CLOTH 2 % EX PADS: 6.0000 | MEDICATED_PAD | Freq: Every day | CUTANEOUS | Status: DC

## 2018-06-23 MED ORDER — HYDROMORPHONE HCL 1 MG/ML IJ SOLN
0.5000 mg | INTRAMUSCULAR | Status: DC | PRN
  Administered 2018-06-23 (×2): 0.5 mg via INTRAVENOUS

## 2018-06-23 MED ORDER — HYDROMORPHONE HCL 1 MG/ML IJ SOLN
INTRAMUSCULAR | Status: AC
  Filled 2018-06-23: qty 1

## 2018-06-23 MED ORDER — LACTATED RINGERS IV SOLN
INTRAVENOUS | Status: DC | PRN
  Administered 2018-06-23: 14:00:00 via INTRAVENOUS

## 2018-06-23 MED ORDER — KCL IN DEXTROSE-NACL 20-5-0.45 MEQ/L-%-% IV SOLN
INTRAVENOUS | Status: DC
  Administered 2018-06-23: 18:00:00 via INTRAVENOUS
  Filled 2018-06-23 (×3): qty 1000

## 2018-06-23 MED ORDER — HYDROMORPHONE HCL 1 MG/ML IJ SOLN
INTRAMUSCULAR | Status: AC
  Administered 2018-06-23: 0.5 mg via INTRAVENOUS
  Filled 2018-06-23: qty 1

## 2018-06-23 MED ORDER — PROPOFOL 10 MG/ML IV BOLUS
INTRAVENOUS | Status: AC
  Filled 2018-06-23: qty 20

## 2018-06-23 MED ORDER — ROCURONIUM BROMIDE 100 MG/10ML IV SOLN
INTRAVENOUS | Status: DC | PRN
  Administered 2018-06-23: 45 mg via INTRAVENOUS
  Administered 2018-06-23 (×2): 10 mg via INTRAVENOUS
  Administered 2018-06-23: 5 mg via INTRAVENOUS

## 2018-06-23 MED ORDER — HYDROMORPHONE HCL 1 MG/ML IJ SOLN
INTRAMUSCULAR | Status: DC | PRN
  Administered 2018-06-23 (×2): 0.5 mg via INTRAVENOUS

## 2018-06-23 MED ORDER — SUGAMMADEX SODIUM 200 MG/2ML IV SOLN
INTRAVENOUS | Status: DC | PRN
  Administered 2018-06-23: 200 mg via INTRAVENOUS

## 2018-06-23 MED ORDER — LIDOCAINE HCL (CARDIAC) PF 100 MG/5ML IV SOSY
PREFILLED_SYRINGE | INTRAVENOUS | Status: DC | PRN
  Administered 2018-06-23: 100 mg via INTRAVENOUS

## 2018-06-23 MED ORDER — OXYCODONE HCL 5 MG PO TABS
5.0000 mg | ORAL_TABLET | ORAL | Status: DC | PRN
  Administered 2018-06-23 – 2018-06-24 (×4): 10 mg via ORAL
  Filled 2018-06-23 (×4): qty 2

## 2018-06-23 MED ORDER — FENTANYL CITRATE (PF) 100 MCG/2ML IJ SOLN
25.0000 ug | INTRAMUSCULAR | Status: AC | PRN
  Administered 2018-06-23 (×6): 25 ug via INTRAVENOUS

## 2018-06-23 MED ORDER — DEXAMETHASONE SODIUM PHOSPHATE 10 MG/ML IJ SOLN
INTRAMUSCULAR | Status: DC | PRN
  Administered 2018-06-23: 4 mg via INTRAVENOUS

## 2018-06-23 MED ORDER — ACETAMINOPHEN 500 MG PO TABS
1000.0000 mg | ORAL_TABLET | Freq: Four times a day (QID) | ORAL | Status: DC
  Administered 2018-06-23 – 2018-06-24 (×3): 1000 mg via ORAL
  Filled 2018-06-23 (×3): qty 2

## 2018-06-23 MED ORDER — FENTANYL CITRATE (PF) 100 MCG/2ML IJ SOLN
INTRAMUSCULAR | Status: AC
  Filled 2018-06-23: qty 2

## 2018-06-23 MED ORDER — CEFAZOLIN SODIUM-DEXTROSE 2-3 GM-%(50ML) IV SOLR
INTRAVENOUS | Status: DC | PRN
  Administered 2018-06-23: 2 g via INTRAVENOUS

## 2018-06-23 MED ORDER — FENTANYL CITRATE (PF) 100 MCG/2ML IJ SOLN
INTRAMUSCULAR | Status: AC
  Administered 2018-06-23: 25 ug via INTRAVENOUS
  Filled 2018-06-23: qty 2

## 2018-06-23 MED ORDER — HEPARIN SODIUM (PORCINE) 5000 UNIT/ML IJ SOLN
INTRAMUSCULAR | Status: AC
  Filled 2018-06-23: qty 1

## 2018-06-23 MED ORDER — ROCURONIUM BROMIDE 50 MG/5ML IV SOLN
INTRAVENOUS | Status: AC
  Filled 2018-06-23: qty 1

## 2018-06-23 MED ORDER — MUPIROCIN 2 % EX OINT
1.0000 "application " | TOPICAL_OINTMENT | Freq: Two times a day (BID) | CUTANEOUS | Status: DC
  Filled 2018-06-23: qty 22

## 2018-06-23 MED ORDER — SUCCINYLCHOLINE CHLORIDE 20 MG/ML IJ SOLN
INTRAMUSCULAR | Status: AC
  Filled 2018-06-23: qty 1

## 2018-06-23 MED ORDER — LIDOCAINE HCL 1 % IJ SOLN
INTRAMUSCULAR | Status: DC | PRN
  Administered 2018-06-23: 20 mL

## 2018-06-23 MED ORDER — ENOXAPARIN SODIUM 40 MG/0.4ML ~~LOC~~ SOLN
40.0000 mg | SUBCUTANEOUS | Status: DC
  Administered 2018-06-24: 40 mg via SUBCUTANEOUS
  Filled 2018-06-23: qty 0.4

## 2018-06-23 MED ORDER — KETOROLAC TROMETHAMINE 30 MG/ML IJ SOLN
INTRAMUSCULAR | Status: AC
  Filled 2018-06-23: qty 1

## 2018-06-23 MED ORDER — BUPIVACAINE HCL (PF) 0.5 % IJ SOLN
INTRAMUSCULAR | Status: AC
  Filled 2018-06-23: qty 30

## 2018-06-23 MED ORDER — MIDAZOLAM HCL 2 MG/2ML IJ SOLN
INTRAMUSCULAR | Status: AC
  Filled 2018-06-23: qty 2

## 2018-06-23 MED ORDER — LIDOCAINE HCL (PF) 1 % IJ SOLN
INTRAMUSCULAR | Status: AC
  Filled 2018-06-23: qty 30

## 2018-06-23 MED ORDER — LIDOCAINE HCL (PF) 2 % IJ SOLN
INTRAMUSCULAR | Status: AC
  Filled 2018-06-23: qty 10

## 2018-06-23 MED ORDER — ONDANSETRON HCL 4 MG/2ML IJ SOLN
INTRAMUSCULAR | Status: AC
  Filled 2018-06-23: qty 2

## 2018-06-23 MED ORDER — MIDAZOLAM HCL 2 MG/2ML IJ SOLN
INTRAMUSCULAR | Status: DC | PRN
  Administered 2018-06-23: 2 mg via INTRAVENOUS

## 2018-06-23 MED ORDER — SODIUM CHLORIDE 0.9 % IV SOLN
2.0000 g | INTRAVENOUS | Status: AC
  Administered 2018-06-23: 2 g via INTRAVENOUS
  Filled 2018-06-23: qty 2

## 2018-06-23 MED ORDER — CEFAZOLIN SODIUM-DEXTROSE 2-4 GM/100ML-% IV SOLN
INTRAVENOUS | Status: AC
  Filled 2018-06-23: qty 100

## 2018-06-23 MED ORDER — SUGAMMADEX SODIUM 200 MG/2ML IV SOLN
INTRAVENOUS | Status: AC
  Filled 2018-06-23: qty 2

## 2018-06-23 MED ORDER — ONDANSETRON HCL 4 MG/2ML IJ SOLN: 4.0000 mg | Freq: Once | INTRAMUSCULAR | Status: DC | PRN

## 2018-06-23 MED ORDER — PROPOFOL 10 MG/ML IV BOLUS
INTRAVENOUS | Status: DC | PRN
  Administered 2018-06-23: 200 mg via INTRAVENOUS

## 2018-06-23 MED ORDER — KETOROLAC TROMETHAMINE 30 MG/ML IJ SOLN
INTRAMUSCULAR | Status: DC | PRN
  Administered 2018-06-23: 30 mg via INTRAVENOUS

## 2018-06-23 SURGICAL SUPPLY — 41 items
APPLIER CLIP ROT 10 11.4 M/L (STAPLE) ×3
CHLORAPREP W/TINT 26ML (MISCELLANEOUS) ×3 IMPLANT
CLIP APPLIE ROT 10 11.4 M/L (STAPLE) ×1 IMPLANT
DECANTER SPIKE VIAL GLASS SM (MISCELLANEOUS) IMPLANT
DERMABOND ADVANCED (GAUZE/BANDAGES/DRESSINGS) ×2
DERMABOND ADVANCED .7 DNX12 (GAUZE/BANDAGES/DRESSINGS) ×1 IMPLANT
DRESSING SURGICEL FIBRLLR 1X2 (HEMOSTASIS) IMPLANT
DRSG SURGICEL FIBRILLAR 1X2 (HEMOSTASIS)
ELECT REM PT RETURN 9FT ADLT (ELECTROSURGICAL)
ELECTRODE REM PT RTRN 9FT ADLT (ELECTROSURGICAL) IMPLANT
GLOVE BIO SURGEON STRL SZ 6.5 (GLOVE) ×4 IMPLANT
GLOVE BIO SURGEON STRL SZ7 (GLOVE) ×3 IMPLANT
GLOVE BIO SURGEONS STRL SZ 6.5 (GLOVE) ×2
GLOVE BIOGEL PI IND STRL 7.5 (GLOVE) ×1 IMPLANT
GLOVE BIOGEL PI INDICATOR 7.5 (GLOVE) ×2
GLOVE INDICATOR 7.0 STRL GRN (GLOVE) ×6 IMPLANT
GOWN STRL REUS W/ TWL LRG LVL3 (GOWN DISPOSABLE) ×3 IMPLANT
GOWN STRL REUS W/TWL LRG LVL3 (GOWN DISPOSABLE) ×6
GRASPER SUT TROCAR 14GX15 (MISCELLANEOUS) ×3 IMPLANT
IRRIGATION STRYKERFLOW (MISCELLANEOUS) IMPLANT
IRRIGATOR STRYKERFLOW (MISCELLANEOUS)
IV NS 1000ML (IV SOLUTION)
IV NS 1000ML BAXH (IV SOLUTION) IMPLANT
KIT TURNOVER KIT A (KITS) ×3 IMPLANT
NEEDLE HYPO 22GX1.5 SAFETY (NEEDLE) ×3 IMPLANT
NEEDLE INSUFFLATION 14GA 120MM (NEEDLE) ×3 IMPLANT
NS IRRIG 1000ML POUR BTL (IV SOLUTION) IMPLANT
NS IRRIG 500ML POUR BTL (IV SOLUTION) ×3 IMPLANT
PACK LAP CHOLECYSTECTOMY (MISCELLANEOUS) ×3 IMPLANT
POUCH SPECIMEN RETRIEVAL 10MM (ENDOMECHANICALS) ×3 IMPLANT
SCISSORS METZENBAUM CVD 33 (INSTRUMENTS) IMPLANT
SLEEVE ENDOPATH XCEL 5M (ENDOMECHANICALS) ×6 IMPLANT
SUT MNCRL 4-0 (SUTURE) ×2
SUT MNCRL 4-0 27XMFL (SUTURE) ×1
SUT MNCRL AB 4-0 PS2 18 (SUTURE) ×3 IMPLANT
SUT VICRYL 0 UR6 27IN ABS (SUTURE) ×3 IMPLANT
SUT VICRYL AB 3-0 FS1 BRD 27IN (SUTURE) ×3 IMPLANT
SUTURE MNCRL 4-0 27XMF (SUTURE) ×1 IMPLANT
TROCAR XCEL NON-BLD 11X100MML (ENDOMECHANICALS) ×3 IMPLANT
TROCAR XCEL NON-BLD 5MMX100MML (ENDOMECHANICALS) ×3 IMPLANT
TUBING INSUFFLATION (TUBING) ×3 IMPLANT

## 2018-06-23 NOTE — Anesthesia Postprocedure Evaluation (Signed)
Anesthesia Post Note  Patient: Nicholas Crane  Procedure(s) Performed: LAPAROSCOPIC CHOLECYSTECTOMY (N/A )  Patient location during evaluation: PACU Anesthesia Type: General Level of consciousness: awake and alert Pain management: pain level controlled Vital Signs Assessment: post-procedure vital signs reviewed and stable Respiratory status: spontaneous breathing, nonlabored ventilation, respiratory function stable and patient connected to nasal cannula oxygen Cardiovascular status: blood pressure returned to baseline and stable Postop Assessment: no apparent nausea or vomiting Anesthetic complications: no     Last Vitals:  Vitals:   06/23/18 1629 06/23/18 1633  BP:  (!) 132/92  Pulse: 80 (!) 54  Resp: 19 16  Temp:    SpO2: 100% 100%    Last Pain:  Vitals:   06/23/18 1633  TempSrc:   PainSc: 9                  Joseph K Piscitello

## 2018-06-23 NOTE — Anesthesia Preprocedure Evaluation (Signed)
Anesthesia Evaluation  Patient identified by MRN, date of birth, ID band Patient awake    Reviewed: Allergy & Precautions, NPO status , Patient's Chart, lab work & pertinent test results  Airway Mallampati: II  TM Distance: >3 FB     Dental  (+) Teeth Intact   Pulmonary Current Smoker,    Pulmonary exam normal        Cardiovascular negative cardio ROS Normal cardiovascular exam     Neuro/Psych negative neurological ROS  negative psych ROS   GI/Hepatic Neg liver ROS,   Endo/Other  negative endocrine ROS  Renal/GU negative Renal ROS  negative genitourinary   Musculoskeletal negative musculoskeletal ROS (+)   Abdominal Normal abdominal exam  (+)   Peds negative pediatric ROS (+)  Hematology negative hematology ROS (+)   Anesthesia Other Findings History reviewed. No pertinent past medical history.  Reproductive/Obstetrics                             Anesthesia Physical Anesthesia Plan  ASA: II  Anesthesia Plan: General   Post-op Pain Management:    Induction: Intravenous  PONV Risk Score and Plan:   Airway Management Planned: Oral ETT  Additional Equipment:   Intra-op Plan:   Post-operative Plan: Extubation in OR  Informed Consent: I have reviewed the patients History and Physical, chart, labs and discussed the procedure including the risks, benefits and alternatives for the proposed anesthesia with the patient or authorized representative who has indicated his/her understanding and acceptance.   Dental advisory given  Plan Discussed with: CRNA and Surgeon  Anesthesia Plan Comments:         Anesthesia Quick Evaluation

## 2018-06-23 NOTE — Anesthesia Post-op Follow-up Note (Signed)
Anesthesia QCDR form completed.        

## 2018-06-23 NOTE — Op Note (Signed)
SURGICAL OPERATIVE REPORT   DATE OF PROCEDURE: 06/23/2018  ATTENDING Surgeon(s): Ancil Linseyavis, Jason Evan, MD  ANESTHESIA: GETA  PRE-OPERATIVE DIAGNOSIS: Symptomatic Cholelithiasis (K80.20)  POST-OPERATIVE DIAGNOSIS: Acute on Chronic Cholecystitis (K80.12)  PROCEDURE(S): (cpt's: 47562) 1.) Laparoscopic Cholecystectomy  INTRAOPERATIVE FINDINGS: Moderately severe pericholecystic inflammation with cystic duct and cystic artery clips well-secured, hemostasis at completion of procedure  INTRAOPERATIVE FLUIDS: 900 mL crystalloid   ESTIMATED BLOOD LOSS: Minimal (<30 mL)   URINE OUTPUT: No foley  SPECIMENS: Gallbladder  IMPLANTS: None  DRAINS: None   COMPLICATIONS: None apparent   CONDITION AT COMPLETION: Hemodynamically stable and extubated  DISPOSITION: PACU   INDICATION(S) FOR PROCEDURE:  Patient is a 32 y.o. male who this admission presented with unrelenting RUQ abdominal pain. Ultrasound suggested cholelithiasis without sonographic evidence to suggest cholecystitis. However, patient's pain persisted despite appropriate efforts to relieve his pain. All risks, benefits, and alternatives to above elective procedures were discussed with the patient, who elected to proceed, and informed consent was accordingly obtained at that time.   DETAILS OF PROCEDURE:  Patient was brought to the operating suite and appropriately identified. General anesthesia was administered along with peri-operative prophylactic IV antibiotics, and endotracheal intubation was performed by anesthesiologist, along with NG/OG tube for gastric decompression. In supine position, operative site was prepped and draped in usual sterile fashion, and following a brief time out, initial 5 mm incision was made in a natural skin crease just above the umbilicus. Fascia was then elevated, and a Verress needle was inserted and its proper position confirmed using aspiration and saline meniscus test.  Upon insufflation of the  abdominal cavity with carbon dioxide to a well-tolerated pressure of 12-15 mmHg, 5 mm peri-umbilical port followed by laparoscope were inserted and used to inspect the abdominal cavity and its contents with no injuries from insertion of the first trochar noted. Three additional trocars were inserted, one at the epigastric position (10 mm) and two along the Right costal margin (5 mm). The table was then placed in reverse Trendelenburg position with the Right side up. Extensive both filmy and dense adhesions between the gallbladder and omentum/duodenum/transverse colon were lysed using combined blunt dissection and selective electrocautery. The apex/dome of the gallbladder was grasped with an atraumatic grasper passed through the lateral port and retracted apically over the liver. The infundibulum was also grasped and retracted, exposing Calot's triangle. The peritoneum overlying the gallbladder infundibulum was incised and dissected free of surrounding peritoneal attachments, revealing the cystic duct and cystic artery, which were clipped twice on the patient side and once on the gallbladder specimen side close to the gallbladder. The gallbladder was then dissected from its peritoneal attachments to the liver using electrocautery, and the gallbladder was placed into a laparoscopic specimen bag and removed from the abdominal cavity via the epigastric port site. Hemostasis and secure placement of clips were confirmed, and intra-peritoneal cavity was inspected with no additional findings. PMI laparoscopic fascial closure device was then used to re-approximate fascia at the 10 mm epigastric port site.  All ports were then removed under direct visualization, and abdominal cavity was desuflated. All port sites were irrigated/cleaned, additional local anesthetic was injected at each incision, 3-0 Vicryl was used to re-approximate dermis at 10 mm port site(s), and subcuticular 4-0 Monocryl suture was used to re-approximate  skin. Skin was then cleaned, dried, and sterile skin glue was applied. Patient was then safely able to be awakened, extubated, and transferred to PACU for post-operative monitoring and care.   I was present for  all aspects of the above procedure, and no operative complications were apparent.

## 2018-06-23 NOTE — Transfer of Care (Signed)
Immediate Anesthesia Transfer of Care Note  Patient: Nicholas Crane  Procedure(s) Performed: LAPAROSCOPIC CHOLECYSTECTOMY (N/A )  Patient Location: PACU  Anesthesia Type:General  Level of Consciousness: awake, alert  and oriented  Airway & Oxygen Therapy: Patient Spontanous Breathing and Patient connected to face mask oxygen  Post-op Assessment: Report given to RN and Post -op Vital signs reviewed and stable  Post vital signs: Reviewed and stable  Last Vitals:  Vitals Value Taken Time  BP 154/100 06/23/2018  4:06 PM  Temp 36.2 C 06/23/2018  4:05 PM  Pulse 93 06/23/2018  4:07 PM  Resp 20 06/23/2018  4:07 PM  SpO2 100 % 06/23/2018  4:07 PM  Vitals shown include unvalidated device data.  Last Pain:  Vitals:   06/23/18 1328  TempSrc: Tympanic  PainSc: 4       Patients Stated Pain Goal: 2 (06/23/18 1328)  Complications: No apparent anesthesia complications

## 2018-06-23 NOTE — Anesthesia Procedure Notes (Signed)
Procedure Name: Intubation Date/Time: 06/23/2018 2:11 PM Performed by: Geraldine Contras, CRNA Pre-anesthesia Checklist: Patient identified, Emergency Drugs available, Patient being monitored, Suction available and Timeout performed Patient Re-evaluated:Patient Re-evaluated prior to induction Oxygen Delivery Method: Circle system utilized Preoxygenation: Pre-oxygenation with 100% oxygen Induction Type: IV induction Ventilation: Mask ventilation without difficulty Laryngoscope Size: Mac and 4 Grade View: Grade I Tube type: Oral Tube size: 8.0 mm Number of attempts: 1 Airway Equipment and Method: Stylet Placement Confirmation: ETT inserted through vocal cords under direct vision,  positive ETCO2 and breath sounds checked- equal and bilateral Secured at: 22 cm Tube secured with: Tape Dental Injury: Teeth and Oropharynx as per pre-operative assessment

## 2018-06-24 ENCOUNTER — Encounter: Payer: Self-pay | Admitting: Surgery

## 2018-06-24 LAB — HIV ANTIBODY (ROUTINE TESTING W REFLEX): HIV Screen 4th Generation wRfx: NONREACTIVE

## 2018-06-24 MED ORDER — OXYCODONE-ACETAMINOPHEN 5-325 MG PO TABS: 1.0000 | ORAL_TABLET | ORAL | 0 refills | Status: DC | PRN

## 2018-06-24 NOTE — Care Management (Signed)
Patient to discharge today.  S/p lap chole.  Patient to discharge with prescription for pain medication. Application for [facility]  And Medication Management  Provided.

## 2018-06-24 NOTE — Progress Notes (Signed)
Discharge instructions reviewed with the patient.  Iv removed.  Waiting on patient friend to pick him up

## 2018-06-24 NOTE — Discharge Instructions (Signed)
In addition to included general post-operative instructions for Laparoscopic Cholecystectomy,  Diet: Resume home heart healthy diet (as discussed).   Activity: No heavy lifting >20 pounds (children, pets, laundry, garbage) or strenuous activity until follow-up, but light activity and walking are encouraged. Do not drive or drink alcohol if taking narcotic pain medications.  Wound care: Remove dressing in 2 days unless otherwise instructed. Once dressing removed, 2 days after surgery (Friday, 6/28), you may shower/get incision wet with soapy water and pat dry (do not rub incisions), but no baths or submerging incision underwater until follow-up.   Medications: Resume all home medications. For mild to moderate pain: acetaminophen (Tylenol). Combining Tylenol with alcohol can substantially increase your risk of causing liver disease. Narcotic pain medications, if prescribed, can be used for severe pain, though may cause nausea, constipation, and drowsiness. Do not combine Tylenol and Percocet (or similar) within a 6 hour period as Percocet (and similar) contain(s) Tylenol. If you do not need the narcotic pain medication, you do not need to fill the prescription.  Call office [phone]) at any time if any questions, worsening pain, fevers/chills, bleeding, drainage from incision site, or other concerns.

## 2018-06-25 ENCOUNTER — Telehealth: Payer: Self-pay

## 2018-06-25 ENCOUNTER — Ambulatory Visit: Payer: MEDICAID

## 2018-06-25 ENCOUNTER — Other Ambulatory Visit: Payer: Self-pay | Admitting: *Deleted

## 2018-06-25 DIAGNOSIS — K8012 Calculus of gallbladder with acute and chronic cholecystitis without obstruction: Secondary | ICD-10-CM

## 2018-06-25 DIAGNOSIS — R1011 Right upper quadrant pain: Secondary | ICD-10-CM

## 2018-06-25 LAB — SURGICAL PATHOLOGY

## 2018-06-25 NOTE — Telephone Encounter (Signed)
Per Dr. Earlene Plateravis, he wanted me to tell patient that he needed blood work and an U/S for today. I then called patient and gave him the information of where to go and to remember not to eat or drink. Patient understood and had no further questions.

## 2018-06-29 NOTE — Discharge Summary (Signed)
Physician Discharge Summary  Patient ID: Nicholas Crane MRN: [id number] DOB/AGE: 32/08/1986 31 y.o.  Admit date: 06/22/2018 Discharge date: 06/24/2018  Admission Diagnoses:  Discharge Diagnoses:  Active Problems:   Acute cholecystitis due to biliary calculus   Calculus of gallbladder with acute and chronic cholecystitis without obstruction   Discharged Condition: good  Hospital Course: 32 y.o. male presented to [facility] ED for abdominal pain. Workup was found to be significant for ultrasound imaging demonstrating cholelithiasis with unrelenting abdominal pain. Informed consent was obtained and documented, and patient underwent laparoscopic cholecystectomy with intra-operative findings of acute on chronic cholecystitis Earlene Plater(Joylene Wescott, 06/23/2018).  Post-operatively, patient's pain improved/resolved and advancement of patient's diet and ambulation were well-tolerated. The remainder of patient's hospital course was essentially unremarkable, and discharge planning was initiated accordingly with patient safely able to be discharged home with appropriate discharge instructions, pain control, and outpatient surgical follow-up after all of his questions were answered to his expressed satisfaction.  Consults: None  Significant Diagnostic Studies: radiology: Ultrasound: cholelithiasis  Treatments: surgery: laparoscopic cholecystectomy Earlene Plater(Sylvestre Rathgeber, 06/23/3018)  Discharge Exam: Blood pressure (!) 150/93, pulse 88, temperature 98.6 F (37 C), temperature source Oral, resp. rate 20, height 6\' 3"  (1.905 m), weight 205 lb (93 kg), SpO2 100 %. General appearance: alert, cooperative and no distress GI: abdomen soft and non-distended with mild epigastric peri-incisional tenderness to palpation, incisions well-approximated without any surrounding erythema or drainage  Disposition:    Allergies as of 06/24/2018   No Known Allergies     Medication List    STOP taking these medications   ibuprofen 600 MG  tablet Commonly known as:  ADVIL,MOTRIN     TAKE these medications   oxyCODONE-acetaminophen 5-325 MG tablet Commonly known as:  PERCOCET/ROXICET Take 1 tablet by mouth every 4 (four) hours as needed for severe pain.      Follow-up Information    Ancil Linseyavis, Hubert Raatz Evan, MD. Go on 07/08/2018.   Specialty:  General Surgery Why:  Thursday July 11th at 2pm for a follow-up appointment  Contact information: [address] [place] [id number] [phone]           Signed: Ancil LinseyJason Evan Hikeem Andersson 06/29/2018, 12:10 AM

## 2018-06-30 ENCOUNTER — Emergency Department: Payer: Self-pay

## 2018-06-30 ENCOUNTER — Emergency Department
Admission: EM | Admit: 2018-06-30 | Discharge: 2018-06-30 | Disposition: A | Discharge status: Home / Self Care | Payer: Self-pay | Attending: Emergency Medicine | Admitting: Emergency Medicine

## 2018-06-30 ENCOUNTER — Encounter: Payer: Self-pay | Admitting: Emergency Medicine

## 2018-06-30 DIAGNOSIS — F17210 Nicotine dependence, cigarettes, uncomplicated: Secondary | ICD-10-CM | POA: Insufficient documentation

## 2018-06-30 DIAGNOSIS — L0291 Cutaneous abscess, unspecified: Secondary | ICD-10-CM

## 2018-06-30 DIAGNOSIS — Y658 Other specified misadventures during surgical and medical care: Secondary | ICD-10-CM | POA: Insufficient documentation

## 2018-06-30 DIAGNOSIS — R1084 Generalized abdominal pain: Secondary | ICD-10-CM

## 2018-06-30 DIAGNOSIS — G8918 Other acute postprocedural pain: Secondary | ICD-10-CM

## 2018-06-30 DIAGNOSIS — T888XXA Other specified complications of surgical and medical care, not elsewhere classified, initial encounter: Secondary | ICD-10-CM | POA: Insufficient documentation

## 2018-06-30 LAB — COMPREHENSIVE METABOLIC PANEL
ALK PHOS: 39 U/L (ref 38–126)
ALT: 25 U/L (ref 0–44)
AST: 27 U/L (ref 15–41)
Albumin: 4.3 g/dL (ref 3.5–5.0)
Anion gap: 8 (ref 5–15)
BUN: 12 mg/dL (ref 6–20)
CHLORIDE: 109 mmol/L (ref 98–111)
CO2: 22 mmol/L (ref 22–32)
CREATININE: 0.85 mg/dL (ref 0.61–1.24)
Calcium: 9.3 mg/dL (ref 8.9–10.3)
Glucose, Bld: 110 mg/dL — ABNORMAL HIGH (ref 70–99)
Potassium: 4 mmol/L (ref 3.5–5.1)
Sodium: 139 mmol/L (ref 135–145)
Total Bilirubin: 0.7 mg/dL (ref 0.3–1.2)
Total Protein: 7.8 g/dL (ref 6.5–8.1)

## 2018-06-30 LAB — CBC WITH DIFFERENTIAL/PLATELET
BASOS PCT: 0 %
Basophils Absolute: 0 10*3/uL (ref 0–0.1)
EOS ABS: 0.2 10*3/uL (ref 0–0.7)
EOS PCT: 2 %
HCT: 37 % — ABNORMAL LOW (ref 40.0–52.0)
Hemoglobin: 13 g/dL (ref 13.0–18.0)
LYMPHS ABS: 2.1 10*3/uL (ref 1.0–3.6)
Lymphocytes Relative: 21 %
MCH: 32.2 pg (ref 26.0–34.0)
MCHC: 35.1 g/dL (ref 32.0–36.0)
MCV: 91.9 fL (ref 80.0–100.0)
MONOS PCT: 8 %
Monocytes Absolute: 0.8 10*3/uL (ref 0.2–1.0)
NEUTROS PCT: 69 %
Neutro Abs: 6.9 10*3/uL — ABNORMAL HIGH (ref 1.4–6.5)
PLATELETS: 351 10*3/uL (ref 150–440)
RBC: 4.02 MIL/uL — AB (ref 4.40–5.90)
RDW: 12.7 % (ref 11.5–14.5)
WBC: 10.1 10*3/uL (ref 3.8–10.6)

## 2018-06-30 LAB — LIPASE, BLOOD: LIPASE: 32 U/L (ref 11–51)

## 2018-06-30 MED ORDER — SODIUM CHLORIDE 0.9 % IV BOLUS
1000.0000 mL | Freq: Once | INTRAVENOUS | Status: AC
  Administered 2018-06-30: 1000 mL via INTRAVENOUS

## 2018-06-30 MED ORDER — POLYETHYLENE GLYCOL 3350 17 GM/SCOOP PO POWD: ORAL | 0 refills | Status: DC

## 2018-06-30 MED ORDER — PIPERACILLIN-TAZOBACTAM 3.375 G IVPB 30 MIN
3.3750 g | Freq: Once | INTRAVENOUS | Status: AC
  Administered 2018-06-30: 3.375 g via INTRAVENOUS
  Filled 2018-06-30: qty 50

## 2018-06-30 MED ORDER — HYDROMORPHONE HCL 1 MG/ML IJ SOLN
1.0000 mg | Freq: Once | INTRAMUSCULAR | Status: AC
  Administered 2018-06-30: 1 mg via INTRAVENOUS
  Filled 2018-06-30: qty 1

## 2018-06-30 MED ORDER — IOHEXOL 300 MG/ML  SOLN
100.0000 mL | Freq: Once | INTRAMUSCULAR | Status: AC | PRN
  Administered 2018-06-30: 100 mL via INTRAVENOUS

## 2018-06-30 MED ORDER — HYDROMORPHONE HCL 1 MG/ML IJ SOLN
1.0000 mg | Freq: Once | INTRAMUSCULAR | Status: AC
  Administered 2018-06-30: 1 mg via INTRAVENOUS

## 2018-06-30 MED ORDER — ONDANSETRON HCL 4 MG/2ML IJ SOLN
4.0000 mg | Freq: Once | INTRAMUSCULAR | Status: AC
  Administered 2018-06-30: 4 mg via INTRAVENOUS
  Filled 2018-06-30: qty 2

## 2018-06-30 MED ORDER — HYDROMORPHONE HCL 1 MG/ML IJ SOLN
INTRAMUSCULAR | Status: AC
  Filled 2018-06-30: qty 1

## 2018-06-30 NOTE — Care Management (Signed)
Previous referral to [facility] and medication management. Message sent to both clinics to check status.

## 2018-06-30 NOTE — ED Provider Notes (Addendum)
[facility] Emergency Department Provider Note  ____________________________________________  Time seen: Approximately 8:49 AM  I have reviewed the triage vital signs and the nursing notes.   HISTORY  Chief Complaint Abdominal Pain and Post-op Problem    HPI Nicholas Crane is a 32 y.o. male who is postop day 6 from a laparoscopic cholecystectomy performed here at [facility] who complains of generalized abdominal pain, nonradiating, worse with movement, severe and sharp, no alleviating factors.  He reports having what he considers severe pain since hospital discharge, but manageable with Percocet and improved by having bowel movements at home.    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  This pain has been unremitting since then.  He cannot find a position of comfort.     History reviewed. No pertinent past medical history.   Patient Active Problem List   Diagnosis Date Noted  . Calculus of gallbladder with acute and chronic cholecystitis without obstruction   . Acute cholecystitis due to biliary calculus 06/22/2018     Past Surgical History:  Procedure Laterality Date  . CHOLECYSTECTOMY N/A 06/23/2018   Procedure: LAPAROSCOPIC CHOLECYSTECTOMY;  Surgeon: Ancil Linseyavis, Jason Evan, MD;  Location: [facility] ORS;  Service: General;  Laterality: N/A;     Prior to Admission medications   Medication Sig Start Date End Date Taking? Authorizing Provider  oxyCODONE-acetaminophen (PERCOCET/ROXICET) 5-325 MG tablet Take 1 tablet by mouth every 4 (four) hours as needed for severe pain. Patient not taking: Reported on 06/30/2018 06/24/18   Ancil Linseyavis, Jason Evan, MD     Allergies Patient has no known allergies.   No family history on file.  Social History Social History   Tobacco Use  . Smoking status: Current Every Day Smoker     Packs/day: 0.50    Types: Cigarettes  . Smokeless tobacco: Never Used  Substance Use Topics  . Alcohol use: Yes    Comment: occase.  . Drug use: No    Review of Systems  Constitutional:   No fever or chills.  ENT:   No sore throat. No rhinorrhea. Cardiovascular:   No chest pain or syncope. Respiratory:   No dyspnea or cough. Gastrointestinal:   Positive as above for abdominal pain.  No vomiting diarrhea or constipation.    Musculoskeletal:   Negative for focal pain or swelling All other systems reviewed and are negative except as documented above in ROS and HPI.  ____________________________________________   PHYSICAL EXAM:  VITAL SIGNS: ED Triage Vitals  Enc Vitals Group     BP 06/30/18 0818 129/71     Pulse Rate 06/30/18 0818 (!) 107     Resp --      Temp 06/30/18 0818 97.9 F (36.6 C)     Temp Source 06/30/18 0818 Oral     SpO2 06/30/18 0818 100 %     Weight 06/30/18 0819 205 lb (93 kg)     Height 06/30/18 0819 6\' 3"  (1.905 m)     Head Circumference --      Peak Flow --      Pain Score 06/30/18 0819 10     Pain Loc --      Pain Edu? --      Excl. in GC? --     Vital signs reviewed, nursing assessments reviewed.   Constitutional:   Alert and oriented.  Very uncomfortable  but nontoxic. Eyes:   Conjunctivae are normal. EOMI. PERRL. ENT      Head:   Normocephalic and atraumatic.      Nose:   No congestion/rhinnorhea.       Mouth/Throat:   MMM, no pharyngeal erythema. No peritonsillar mass.       Neck:   No meningismus. Full ROM. Hematological/Lymphatic/Immunilogical:   No cervical lymphadenopathy. Cardiovascular:   RRR. Symmetric bilateral radial and DP pulses.  No murmurs.  Respiratory:   Normal respiratory effort without tachypnea/retractions. Breath sounds are clear and equal bilaterally. No wheezes/rales/rhonchi. Gastrointestinal:   Soft with severe generalized tenderness. Non distended. There is no CVA tenderness.  Positive rebound without rigidity or  guarding. Musculoskeletal:   Normal range of motion in all extremities. No joint effusions.  No lower extremity tenderness.  No edema. Neurologic:   Normal speech and language.  Motor grossly intact. No acute focal neurologic deficits are appreciated.  Skin:    Skin is warm, dry and intact.  4 laparoscopic surgical sites are all closed and healing appropriately without inflammatory changes.  There is subacute ecchymosis around the periumbilical laparoscopic port site. ____________________________________________    LABS (pertinent positives/negatives) (all labs ordered are listed, but only abnormal results are displayed) Labs Reviewed  COMPREHENSIVE METABOLIC PANEL - Abnormal; Notable for the following components:      Result Value   Glucose, Bld 110 (*)    All other components within normal limits  CBC WITH DIFFERENTIAL/PLATELET - Abnormal; Notable for the following components:   RBC 4.02 (*)    HCT 37.0 (*)    Neutro Abs 6.9 (*)    All other components within normal limits  LIPASE, BLOOD   ____________________________________________   EKG    ____________________________________________    RADIOLOGY  Ct Abdomen Pelvis W Contrast  Result Date: 06/30/2018 CLINICAL DATA:  Status post cholecystectomy 1 week ago with worsening upper abdominal pain. EXAM: CT ABDOMEN AND PELVIS WITH CONTRAST TECHNIQUE: Multidetector CT imaging of the abdomen and pelvis was performed using the standard protocol following bolus administration of intravenous contrast. CONTRAST:  OMNIPAQUE IOHEXOL 300 MG/ML  SOLN COMPARISON:  06/22/2018. FINDINGS: Lower chest: Unremarkable. Hepatobiliary: No focal abnormality within the liver parenchyma. Surgical clips are seen in the region of the distal common duct. 6.9 x 6.4 x 10.2 cm heterogeneous collection is identified in the gallbladder fossa. Gas is evident along the nondependent portion of this collection. There is high attenuation material in the lateral  aspect of the collection, presumably representing surgical packing material with retained stones less likely. No intrahepatic biliary duct dilatation. The common bile duct in the head of the pancreas is nondilated. Pancreas: No focal mass lesion. No dilatation of the main duct. No intraparenchymal cyst. No peripancreatic edema. Spleen: No splenomegaly. No focal mass lesion. Adrenals/Urinary Tract: No adrenal nodule or mass. 7 mm nonobstructing stone is identified in the interpolar right kidney. Left kidney unremarkable. No evidence for hydroureter. The urinary bladder appears normal for the degree of distention. Stomach/Bowel: Stomach is nondistended. No gastric wall thickening. No evidence of outlet obstruction. Mass-effect from the gallbladder fossa collection is evident on the duodenum. No small bowel wall thickening. No small bowel dilatation. The terminal ileum is normal. The appendix is normal. No gross colonic mass. There is mass-effect on the hepatic flexure and edema in the right upper quadrant omentum and gastrocolic ligament. Vascular/Lymphatic: No abdominal aortic aneurysm. There is no gastrohepatic or hepatoduodenal ligament lymphadenopathy. No intraperitoneal or retroperitoneal lymphadenopathy. No pelvic sidewall  lymphadenopathy. Reproductive: The prostate gland and seminal vesicles have normal imaging features. Other: Small volume free fluid is identified in the pelvis with attenuation higher than would be expected for simple fluid. There is some fluid in the right paracolic gutter in trace fluid noted along the inferior margin of the liver. Musculoskeletal: No worrisome lytic or sclerotic osseous abnormality. Soft tissue attenuation in the subcutaneous fat of the anterior abdominal wall is associated with subcutaneous gas, not incompatible with the surgery on 06/23/2018. IMPRESSION: 1. 7 x 6 x 10 cm heterogeneous collection in the gallbladder fossa is most suggestive of a hematoma and superinfection  cannot be excluded by imaging. There is a small amount of gas associated with this collection, not unexpected on postop day 7. High attenuation debris within the collection is presumably surgical packing material. Retained gallstones considered less likely but not excluded. 2. Small volume high attenuation fluid in the anatomic pelvis. Likely hemoperitoneum although infected free fluid could have this attenuation. 3. Linear bands of soft tissue attenuation in the subcutaneous fat of the anterior abdominal wall associated with subcutaneous gas in the anterior abdominal wall is compatible with port placement during surgery. Electronically Signed   By: Kennith Center M.D.   On: 06/30/2018 10:08    ____________________________________________   PROCEDURES Procedures  ____________________________________________  DIFFERENTIAL DIAGNOSIS   Bowel perforation, biliary leak, intra-abdominal hemorrhage, postoperative pain, abdominal wall strain  CLINICAL IMPRESSION / ASSESSMENT AND PLAN / ED COURSE  Pertinent labs & imaging results that were available during my care of the patient were reviewed by me and considered in my medical decision making (see chart for details).    Patient presents with worsening generalized abdominal pain after laparoscopic abdominal surgery.  I reviewed the electronic medical record, operative note and discharge summary from the surgical service, which notes no apparent complications or difficulties during surgery or postoperative course.  However, patient currently presents with severe pain as well as tachycardia.  Exam is concerning.  I will obtain a CT scan of the abdomen and pelvis to evaluate for a possible surgical complication versus inadvertent injury to his healing tissues due to the patient's strenuous work activity so soon after the procedure.  Clinical Course as of Jun 30 1516  Wed Jun 30, 2018  1049 CT report concerning for hemoperitoneum from the surgical area of  the gallbladder fossa.  Potential for superimposed infection.  I called the on-call surgeon who referred me to the patient's own surgeon during daytime hours.  Dr. Earlene Plater is currently in a surgical case in the OR and will call back when available.  In the meantime I will give the patient Zosyn to avoid delay in covering a potential peritonitis.   [PS]  1250 Discussed with Dr. Earlene Plater who recommends IR drainage.  If this can be achieved today in the ED, patient will be suitable for outpatient follow-up again.   [PS]  1318 Discussed with interventional radiology who feels that most likely the collection is clotted blood which will not be amenable to drainage.  Followed up with Dr. Earlene Plater who agrees that if IR feels the drainage would be unlikely to succeed there is no need to pursue it.  Based on his evaluation he does feel the patient is stable for discharge home either way and does Artie have an appointment to follow-up in surgery clinic.  Recommends adding MiraLAX to GI regimen at home.   [PS]    Clinical Course User Index [PS] Sharman Cheek, MD      -----------------------------------------  12:04 PM on 06/30/2018 -----------------------------------------  Still waiting for surgery evaluation.  Patient remains hemodynamically stable.  ----------------------------------------- 3:17 PM on 06/30/2018 -----------------------------------------  Discussed with interventional radiology and general surgery.  IR feels that due to this being blood, it will not be amenable to percutaneous drainage.  Dr. Earlene Plater feels that it would not be beneficial to attempt drainage given its unlikely to be successful and feels the patient is stable for discharge home without.  Vital signs remain normal.  On reassessment pain is controlled, discharge home, add MiraLAX.  Advised the patient to avoid strenuous activity heavy lifting and other Valsalva maneuvers. ____________________________________________   FINAL  CLINICAL IMPRESSION(S) / ED DIAGNOSES    Final diagnoses:  Generalized abdominal pain  Hemoperitoneum     ED Discharge Orders    None      Portions of this note were generated with dragon dictation software. Dictation errors may occur despite best attempts at proofreading.    Sharman Cheek, MD 06/30/18 1204    Sharman Cheek, MD 06/30/18 [phone]

## 2018-06-30 NOTE — ED Triage Notes (Signed)
Pt last BM was this am, has to take stool softners.

## 2018-06-30 NOTE — ED Triage Notes (Signed)
Pt reports had gallbladder removed last Wednesday and last night started with pain to his upper abdomen. Pt reports paain became severe this am.

## 2018-06-30 NOTE — Consult Note (Addendum)
SURGICAL CONSULTATION NOTE (initial) - cpt: [id number]  HISTORY OF PRESENT ILLNESS (HPI):  32 y.o. male presented to [facility] ED today for evaluation of RUQ abdominal pain. Patient reports he called surgery office last week for unresolved abdominal pain and was advised/scheduled labs and RUQ abdominal ultrasound, but he says his pain completely resolved following a BM, so the advised studies were never performed. Patient today says he was lifting and sawing heavy wood at work yesterday when he experienced acute onset of RUQ abdominal pain. He otherwise says all of his pre-surgical symptoms have resolved completely, and he has been eating and drinking with +flatus and denies N/V, fever/chills, CP, or SOB. He still expresses concern regarding constipation, however, particularly since he stopped taking narcotic pain medications 3 - 4 days ago and most of his BM's have been loose.  Surgery is consulted by ED physician Dr. Scotty CourtStafford in this context for evaluation and management of post-surgical abdominal pain.  PAST MEDICAL HISTORY (PMH):  History reviewed. No pertinent past medical history.   PAST SURGICAL HISTORY (PSH):  Past Surgical History:  Procedure Laterality Date  . CHOLECYSTECTOMY N/A 06/23/2018   Procedure: LAPAROSCOPIC CHOLECYSTECTOMY;  Surgeon: Ancil Linseyavis, Jene Huq Evan, MD;  Location: [facility] ORS;  Service: General;  Laterality: N/A;     MEDICATIONS:  Prior to Admission medications   Medication Sig Start Date End Date Taking? Authorizing Provider  oxyCODONE-acetaminophen (PERCOCET/ROXICET) 5-325 MG tablet Take 1 tablet by mouth every 4 (four) hours as needed for severe pain. Patient not taking: Reported on 06/30/2018 06/24/18   Ancil Linseyavis, Shiasia Porro Evan, MD     ALLERGIES:  No Known Allergies   SOCIAL HISTORY:  Social History   Socioeconomic History  . Marital status: Single    Spouse name: Not on file  . Number of children: Not on file  . Years of education: Not on file  . Highest education level: Not  on file  Occupational History  . Not on file  Social Needs  . Financial resource strain: Not on file  . Food insecurity:    Worry: Not on file    Inability: Not on file  . Transportation needs:    Medical: Not on file    Non-medical: Not on file  Tobacco Use  . Smoking status: Current Every Day Smoker    Packs/day: 0.50    Types: Cigarettes  . Smokeless tobacco: Never Used  Substance and Sexual Activity  . Alcohol use: Yes    Comment: occase.  . Drug use: No  . Sexual activity: Not on file  Lifestyle  . Physical activity:    Days per week: Not on file    Minutes per session: Not on file  . Stress: Not on file  Relationships  . Social connections:    Talks on phone: Not on file    Gets together: Not on file    Attends religious service: Not on file    Active member of club or organization: Not on file    Attends meetings of clubs or organizations: Not on file    Relationship status: Not on file  . Intimate partner violence:    Fear of current or ex partner: Not on file    Emotionally abused: Not on file    Physically abused: Not on file    Forced sexual activity: Not on file  Other Topics Concern  . Not on file  Social History Narrative  . Not on file    The patient currently resides (home / rehab  facility / nursing home): Home The patient normally is (ambulatory / bedbound): Ambulatory   FAMILY HISTORY:  No family history on file.   REVIEW OF SYSTEMS:  Constitutional: denies weight loss, fever, chills, or sweats  Eyes: denies any other vision changes, history of eye injury  ENT: denies sore throat, hearing problems  Respiratory: denies shortness of breath, wheezing  Cardiovascular: denies chest pain, palpitations  Gastrointestinal: abdominal pain, N/V, and bowel function as per HPI Genitourinary: denies burning with urination or urinary frequency Musculoskeletal: denies any other joint pains or cramps  Skin: denies any other rashes or skin discolorations   Neurological: denies any other headache, dizziness, weakness  Psychiatric: denies any other depression, anxiety   All other review of systems were negative   VITAL SIGNS:  Temp:  [97.9 F (36.6 C)] 97.9 F (36.6 C) (07/03 0818) Pulse Rate:  [81-107] 81 (07/03 1130) Resp:  [17-19] 17 (07/03 1130) BP: (104-140)/(70-91) 104/70 (07/03 1130) SpO2:  [89 %-100 %] 89 % (07/03 1130) Weight:  [205 lb (93 kg)] 205 lb (93 kg) (07/03 0819)     Height: 6\' 3"  (190.5 cm) Weight: 205 lb (93 kg) BMI (Calculated): 25.62   INTAKE/OUTPUT:  This shift: Total I/O In: 1000 [IV Piggyback:1000] Out: -   Last 2 shifts: @IOLAST2SHIFTS @   PHYSICAL EXAM:  Constitutional:  -- Normal body habitus  -- Awake, alert, and oriented x3, no apparent distress Eyes:  -- Pupils equally round and reactive to light  -- No scleral icterus, B/L no occular discharge Ear, nose, throat: -- Neck is FROM WNL -- No jugular venous distension  Pulmonary:  -- No wheezes or rhales -- Equal breath sounds bilaterally -- Breathing non-labored at rest Cardiovascular:  -- S1, S2 present  -- No pericardial rubs  Gastrointestinal:  -- Abdomen soft and non-distended with mild RUQ abdominal pain, no guarding or rebound tenderness -- Minimal Right of epigastric peri-incisional tenderness to palpation, all incisions well-approximated without erythema or drainage -- No abdominal masses appreciated, pulsatile or otherwise Musculoskeletal and Integumentary:  -- Wounds or skin discoloration: None appreciated except as described above (GI) -- Extremities: B/L UE and LE FROM, hands and feet warm, no edema  Neurologic:  -- Motor function: Intact and symmetric -- Sensation: Intact and symmetric Psychiatric:  -- Mood and affect WNL  Labs:  CBC Latest Ref Rng & Units 06/30/2018 06/23/2018 06/22/2018  WBC 3.8 - 10.6 K/uL 10.1 6.7 7.4  Hemoglobin 13.0 - 18.0 g/dL 16.1 09.6 04.5  Hematocrit 40.0 - 52.0 % 37.0(L) 41.0 44.2  Platelets 150 -  440 K/uL 351 183 220   CMP Latest Ref Rng & Units 06/30/2018 06/23/2018 06/22/2018  Glucose 70 - 99 mg/dL 409(W) 119(J) 89  BUN 6 - 20 mg/dL 12 15 14   Creatinine 0.61 - 1.24 mg/dL 4.78 2.95 6.21  Sodium 135 - 145 mmol/L 139 138 139  Potassium 3.5 - 5.1 mmol/L 4.0 3.7 3.7  Chloride 98 - 111 mmol/L 109 106 108  CO2 22 - 32 mmol/L 22 26 24   Calcium 8.9 - 10.3 mg/dL 9.3 3.0(Q) 9.0  Total Protein 6.5 - 8.1 g/dL 7.8 5.7(L) 6.8  Total Bilirubin 0.3 - 1.2 mg/dL 0.7 0.6 0.4  Alkaline Phos 38 - 126 U/L 39 37(L) 49  AST 15 - 41 U/L 27 16 22   ALT 0 - 44 U/L 25 18 24    Imaging studies:  CT Abdomen and Pelvis with Contrast (06/30/2018) 1. 7 x 6 x 10 cm heterogeneous collection in the gallbladder  fossa is most suggestive of a hematoma and superinfection cannot be excluded by imaging. There is a small amount of gas associated with this collection, not unexpected on postop day 7. High attenuation debris within the collection is presumably surgical packing material. Retained gallstones considered less likely but not excluded. 2. Small volume high attenuation fluid in the anatomic pelvis. Likely hemoperitoneum although infected free fluid could have this attenuation. 3. Linear bands of soft tissue attenuation in the subcutaneous fat of the anterior abdominal wall associated with subcutaneous gas in the anterior abdominal wall is compatible with port placement during surgery.  Assessment/Plan: (ICD-10's: G3.18) 32 y.o. male with most likely post-surgical gallbladder fossa hematoma considering radiographic appearance with LFT's and WBC WNL.   - pain control prn   - no heavy lifting >15 - 20 lbs or strenuous activity until follow-up  - Miralax until BM's normalize + once daily stool softener (Colace) while taking narcotic pain medication   - recommend IR evaluation for possible drainage of fluid collection, though anticipate little value if thrombus  - if IR does not recommend drainage, outpatient  surgical follow-up as previously scheduled   - discussed relationship between post-cholecystectomy diet and BM's  All of the above findings and recommendations were discussed with the patient and ED physician, and all of patient's questions were answered to his expressed satisfaction.  Thank you for the opportunity to participate in this patient's care.   -- Scherrie Gerlach Earlene Plater, MD, RPVI [place]: [facility] General [facility] - Partnering for exceptional care. Office: [phone]

## 2018-07-06 ENCOUNTER — Telehealth: Payer: Self-pay | Admitting: Surgery

## 2018-07-06 NOTE — Telephone Encounter (Signed)
Called patient to let him know that I was able to speak with Dr. Earlene Plateravis and that he would be able to see him tomorrow 07/07/2018 at 9:00 AM. Patient agreed.

## 2018-07-06 NOTE — Telephone Encounter (Signed)
Patient has called and complains of right abdominal pain -middle. Patient had a laparoscopic cholecystectomy by Dr Earlene Plateravis on 06/23/18. Pain level-6. No nausea, vomiting, fever. Having 1 BM a day-hard and having to strain. Patient states that he did take the miralax for 2 days and is still taking stool softness. Patient was seen in the ED on 06/30/18 for abdominal pain as well. Please call patient and advise.

## 2018-07-06 NOTE — Telephone Encounter (Signed)
Patient has called again asking if we have heard anything from the provider.

## 2018-07-07 ENCOUNTER — Ambulatory Visit (INDEPENDENT_AMBULATORY_CARE_PROVIDER_SITE_OTHER): Payer: Self-pay | Admitting: Surgery

## 2018-07-07 ENCOUNTER — Telehealth: Payer: Self-pay

## 2018-07-07 ENCOUNTER — Encounter: Payer: Self-pay | Admitting: Surgery

## 2018-07-07 VITALS — BP 143/92 | HR 86 | Temp 98.0°F | Ht 75.0 in | Wt 194.6 lb

## 2018-07-07 DIAGNOSIS — K8012 Calculus of gallbladder with acute and chronic cholecystitis without obstruction: Secondary | ICD-10-CM

## 2018-07-07 DIAGNOSIS — Z4889 Encounter for other specified surgical aftercare: Secondary | ICD-10-CM

## 2018-07-07 MED ORDER — OXYCODONE HCL 5 MG PO TABS: 5.0000 mg | ORAL_TABLET | Freq: Four times a day (QID) | ORAL | 0 refills | Status: DC | PRN

## 2018-07-07 MED ORDER — OXYCODONE HCL 5 MG PO TABS: 5.0000 mg | ORAL_TABLET | Freq: Three times a day (TID) | ORAL | 0 refills | Status: DC | PRN

## 2018-07-07 NOTE — Progress Notes (Addendum)
Surgical Clinic Progress/Follow-up Note   HPI:  32 y.o. Male presents to clinic for post-op follow-up 2 weeks s/p laparoscopic cholecystectomy Nicholas Crane(Nicholas Crane, 06/23/2018). Patient reports his RUQ abdominal pain which he reports did not begin until 1 week following his surgery and for which he was seen in ED and diagnosed on CT with a RUQ gallbladder fossa thrombus has improved somewhat since he was discharged from [facility] ED, but he has continued to work beyond advised restrictions despite his boss reportedly scolding him for risking injury by working too much because he has been attempting to demonstrate his ability to provide a stable home for his daughter he reports has been kidnapped by child's mother. He denies fever/chills, CP, or SOB, but adds he continues to only pass "rabbit turd" BM's despite having not taken narcotics since seen in ED despite ongoing +flatus. Otherwise, his epigastric pain has nearly resolved with no N/V or diarrhea. Of note, patient reports nearly complete cessation of smoking despite social stressors and abdominal pain.  Review of Systems:  Constitutional: denies fever/chills  Respiratory: denies shortness of breath, wheezing  Cardiovascular: denies chest pain, palpitations  Gastrointestinal: abdominal pain, N/V, and bowel function as per interval history Skin: Denies any other rashes or skin discolorations except post-surgical wounds  Vital Signs:  BP (!) 143/92   Pulse 86   Temp 98 F (36.7 C) (Oral)   Ht 6\' 3"  (1.905 m)   Wt 194 lb 9.6 oz (88.3 kg)   BMI 24.32 kg/m    Physical Exam:  Constitutional:  -- Normal body habitus  -- Awake, alert, and oriented x3  Pulmonary:  -- No crackles -- Equal breath sounds bilaterally -- Breathing non-labored at rest Cardiovascular:  -- S1, S2 present  -- No pericardial rubs  Gastrointestinal:  -- Soft and non-distendedwith moderate RUQ abdominal tenderness to palpation with minimal peri-incisional tenderness to palpation,  no guarding/rebound tenderness -- Post-surgical incisions all well-approximated without any peri-incisional erythema or drainage -- No abdominal masses appreciated, pulsatile or otherwise  Musculoskeletal / Integumentary:  -- Wounds or skin discoloration: None appreciated except post-surgical incisions as described above (GI) -- Extremities: B/L UE and LE FROM, hands and feet warm, no edema   Laboratory studies:  CBC Latest Ref Rng & Units 06/30/2018 06/23/2018 06/22/2018  WBC 3.8 - 10.6 K/uL 10.1 6.7 7.4  Hemoglobin 13.0 - 18.0 g/dL 16.113.0 09.614.2 04.515.4  Hematocrit 40.0 - 52.0 % 37.0(L) 41.0 44.2  Platelets 150 - 440 K/uL 351 183 220   CMP Latest Ref Rng & Units 06/30/2018 06/23/2018 06/22/2018  Glucose 70 - 99 mg/dL 409(W110(H) 119(J102(H) 89  BUN 6 - 20 mg/dL 12 15 14   Creatinine 0.61 - 1.24 mg/dL 4.780.85 2.950.91 6.210.87  Sodium 135 - 145 mmol/L 139 138 139  Potassium 3.5 - 5.1 mmol/L 4.0 3.7 3.7  Chloride 98 - 111 mmol/L 109 106 108  CO2 22 - 32 mmol/L 22 26 24   Calcium 8.9 - 10.3 mg/dL 9.3 3.0(Q8.5(L) 9.0  Total Protein 6.5 - 8.1 g/dL 7.8 5.7(L) 6.8  Total Bilirubin 0.3 - 1.2 mg/dL 0.7 0.6 0.4  Alkaline Phos 38 - 126 U/L 39 37(L) 49  AST 15 - 41 U/L 27 16 22   ALT 0 - 44 U/L 25 18 24     Imaging: No new pertinent imaging available for review   Assessment:  32 y.o. yo Male with a problem list including...  Patient Active Problem List   Diagnosis Date Noted  . Calculus of gallbladder with acute and chronic  cholecystitis without obstruction   . Acute cholecystitis due to biliary calculus 06/22/2018    presents to clinic for post-op follow-up evaluation of RUQ pain and constipation 2 weeks s/p laparoscopic cholecystectomy Nicholas Crane, 06/23/2018) for severe cholecystitis, complicated by post-surgical gallbladder fossa hematoma.  Plan:              - advance diet as tolerated              - okay to submerge incisions under water (baths, swimming) prn             - advised to take Colace 100 mg BID with adequate  hydration, fiber, and prn Miralax or magnesium citrate  - discussed importance of allowing sufficient recovery without overdoing lifting and activity restrictions             - reluctantly agreed to prescribe 15 Percocet 5/325 tablets as bridge to no further narcotics             - if pain does not continue to improve, may need follow-up imaging (u/s or CT)  - follow-up in clinic in 1 week, instructed to call if issues/concerns sooner  All of the above recommendations were discussed with the patient, and all of patient's questions were answered to his expressed satisfaction.  -- Scherrie Gerlach Nicholas Plater, MD, RPVI [place]: [facility] General [facility] - Partnering for exceptional care. Office: [phone]

## 2018-07-07 NOTE — Patient Instructions (Signed)
Please drink a bottle of Magnesium Citrate for the constipation. You may take 2 Colace daily. Please increase your water intake to 72 ounces daily.   You may take Miralax as well to avoid constipation.  Please do not do any lifting over 20 pounds until you see your doctor next week. He will discuss the lifting restrictions with you.    Constipation, Adult Constipation is when a person:  Poops (has a bowel movement) fewer times in a week than normal.  Has a hard time pooping.  Has poop that is dry, hard, or bigger than normal.  Follow these instructions at home: Eating and drinking   Eat foods that have a lot of fiber, such as: ? Fresh fruits and vegetables. ? Whole grains. ? Beans.  Eat less of foods that are high in fat, low in fiber, or overly processed, such as: ? [place] fries. ? Hamburgers. ? Cookies. ? Candy. ? Soda.  Drink enough fluid to keep your pee (urine) clear or pale yellow. General instructions  Exercise regularly or as told by your doctor.  Go to the restroom when you feel like you need to poop. Do not hold it in.  Take over-the-counter and prescription medicines only as told by your doctor. These include any fiber supplements.  Do pelvic floor retraining exercises, such as: ? Doing deep breathing while relaxing your lower belly (abdomen). ? Relaxing your pelvic floor while pooping.  Watch your condition for any changes.  Keep all follow-up visits as told by your doctor. This is important. Contact a doctor if:  You have pain that gets worse.  You have a fever.  You have not pooped for 4 days.  You throw up (vomit).  You are not hungry.  You lose weight.  You are bleeding from the anus.  You have thin, pencil-like poop (stool). Get help right away if:  You have a fever, and your symptoms suddenly get worse.  You leak poop or have blood in your poop.  Your belly feels hard or bigger than normal (is bloated).  You have very bad  belly pain.  You feel dizzy or you faint. This information is not intended to replace advice given to you by your health care provider. Make sure you discuss any questions you have with your health care provider. Document Released: 06/02/2008 Document Revised: 07/04/2016 Document Reviewed: 06/04/2016 Elsevier Interactive Patient Education  2018 [employer]

## 2018-07-07 NOTE — Telephone Encounter (Signed)
[facility] pharmacist called to let us know that Dr.Davis's DEA number has expired and they are unable to fill the prescription that patient dropped off.    Per Dr.Pabon new prescription was written and patient was notifed to come by office to pick up.  Pharmacist stated once the patient brings new prescription they will shred the old one.   Prescription placed at front desk.

## 2018-07-08 ENCOUNTER — Encounter: Payer: Self-pay | Admitting: Surgery

## 2018-07-13 ENCOUNTER — Ambulatory Visit (INDEPENDENT_AMBULATORY_CARE_PROVIDER_SITE_OTHER): Payer: Self-pay | Admitting: Surgery

## 2018-07-13 ENCOUNTER — Encounter: Payer: Self-pay | Admitting: Surgery

## 2018-07-13 VITALS — BP 145/105 | HR 93 | Temp 97.8°F | Wt 195.0 lb

## 2018-07-13 DIAGNOSIS — K8012 Calculus of gallbladder with acute and chronic cholecystitis without obstruction: Secondary | ICD-10-CM

## 2018-07-13 NOTE — Progress Notes (Signed)
Surgical Clinic Progress/Follow-up Note   HPI:  32 y.o. Male presents to clinic for subsequent post-op follow-up 3 weeks s/p laparoscopic cholecystectomy Nicholas Crane(Nicholas Crane, 06/23/2018). Patient for the first time this week reports much improved post-surgical RUQ abdominal pain and ongoing complete resolution of pre-operative pain. He says continues to tolerate regular diet with +flatus and denies N/V, fever/chills, CP, or SOB. However, he continues to be challenged by what he describes as constipation despite adequate hydration, high-fiber diet, Colace stool softener, and Miralax/magnesium citrate. Specifically, he says his daily BM's are not as much as they "should be".  Review of Systems:  Constitutional: denies fever/chills  Respiratory: denies shortness of breath, wheezing  Cardiovascular: denies chest pain, palpitations  Gastrointestinal: abdominal pain, N/V, and bowel function as per interval history Skin: Denies any other rashes or skin discolorations except post-surgical wounds as per interval history  Vital Signs:  BP (!) 145/105   Pulse 93   Temp 97.8 F (36.6 C) (Oral)   Wt 195 lb (88.5 kg)   BMI 24.37 kg/m    Physical Exam:  Constitutional:  -- Normal body habitus  -- Awake, alert, and oriented x3  Pulmonary:  -- No crackles -- Equal breath sounds bilaterally -- Breathing non-labored at rest Cardiovascular:  -- S1, S2 present  -- No pericardial rubs  Gastrointestinal:  -- Soft and non-distended, non-tender to palpation, no guarding/rebound tenderness -- Post-surgical incisions all well-approximated without any peri-incisional erythema or drainage -- No abdominal masses appreciated, pulsatile or otherwise  Musculoskeletal / Integumentary:  -- Wounds or skin discoloration: None appreciated except post-surgical incisions as described above (GI) -- Extremities: B/L UE and LE FROM, hands and feet warm, no edema   Assessment:  32 y.o. yo Male with a problem list including...   Patient Active Problem List   Diagnosis Date Noted  . Calculus of gallbladder with acute and chronic cholecystitis without obstruction     presents to clinic for subsequent post-op follow-up evaluation, doing well despite ongoing HTN and chronic constipation 3 weeks s/p laparoscopic cholecystectomy Nicholas Crane(Nicholas Crane, 06/23/2018) for acute on chronic calculous cholecystitis.  Plan:              - advance diet as tolerated  - contact information for primary care hotline provided             - okay to submerge incisions under water (baths, swimming) prn             - gradually resume all activities without restrictions over next 2 weeks  - encouraged to establish primary care for HTN, constipation, and anxiety             - apply sunblock particularly to incisions with sun exposure to reduce pigmentation of scars             - return to clinic as needed, instructed to call office if any questions or concerns  All of the above recommendations were discussed with the patient, and all of patient's questions were answered to his expressed satisfaction.  -- Scherrie GerlachJason E. Nicholas Plateravis, MD, RPVI [place]:  [facility] - Partnering for exceptional care. Office: [phone]

## 2018-07-13 NOTE — Patient Instructions (Addendum)
Please try to get in contact with your primary care provider and let him/her know that you have been feeling anxious and worry too much.    GENERAL POST-OPERATIVE PATIENT INSTRUCTIONS   WOUND CARE INSTRUCTIONS:  Keep a dry clean dressing on the wound if there is drainage. The initial bandage may be removed after 24 hours.  Once the wound has quit draining you may leave it open to air.  If clothing rubs against the wound or causes irritation and the wound is not draining you may cover it with a dry dressing during the daytime.  Try to keep the wound dry and avoid ointments on the wound unless directed to do so.  If the wound becomes bright red and painful or starts to drain infected material that is not clear, please contact your physician immediately.  If the wound is mildly pink and has a thick firm ridge underneath it, this is normal, and is referred to as a healing ridge.  This will resolve over the next 4-6 weeks.  BATHING: You may shower if you have been informed of this by your surgeon. However, Please do not submerge in a tub, hot tub, or pool until incisions are completely sealed or have been told by your surgeon that you may do so.  DIET:  You may eat any foods that you can tolerate.  It is a good idea to eat a high fiber diet and take in plenty of fluids to prevent constipation.  If you do become constipated you may want to take a mild laxative or take ducolax tablets on a daily basis until your bowel habits are regular.  Constipation can be very uncomfortable, along with straining, after recent surgery.  ACTIVITY:  You are encouraged to cough and deep breath or use your incentive spirometer if you were given one, every 15-30 minutes when awake.  This will help prevent respiratory complications and low grade fevers post-operatively if you had a general anesthetic.  You may want to hug a pillow when coughing and sneezing to add additional support to the surgical area, if you had abdominal or  chest surgery, which will decrease pain during these times.  You are encouraged to walk and engage in light activity for the next two weeks.  You should not lift more than 20 pounds, until 07/21/2018 as it could put you at increased risk for complications.  Twenty pounds is roughly equivalent to a plastic bag of groceries. At that time- Listen to your body when lifting, if you have pain when lifting, stop and then try again in a few days. Soreness after doing exercises or activities of daily living is normal as you get back in to your normal routine.  MEDICATIONS:  Try to take narcotic medications and anti-inflammatory medications, such as tylenol, ibuprofen, naprosyn, etc., with food.  This will minimize stomach upset from the medication.  Should you develop nausea and vomiting from the pain medication, or develop a rash, please discontinue the medication and contact your physician.  You should not drive, make important decisions, or operate machinery when taking narcotic pain medication.  SUNBLOCK Use sun block to incision area over the next year if this area will be exposed to sun. This helps decrease scarring and will allow you avoid a permanent darkened area over your incision.  QUESTIONS:  Please feel free to call our office if you have any questions, and we will be glad to assist you. [phone]

## 2018-07-18 DIAGNOSIS — G8918 Other acute postprocedural pain: Secondary | ICD-10-CM | POA: Insufficient documentation

## 2019-03-26 IMAGING — CT CT ABD-PELV W/ CM
2 of 4 series · 15 of 46 positions shown, 17 images · IV contrast (APPLIED)
Comparison: 06/22/2018.

CLINICAL DATA: Status post cholecystectomy 1 week ago with
worsening upper abdominal pain.

EXAM:
CT ABDOMEN AND PELVIS WITH CONTRAST
TECHNIQUE: Multidetector CT imaging of the abdomen and pelvis was performed
using the standard protocol following bolus administration of
intravenous contrast.
CONTRAST:  100mL OMNIPAQUE IOHEXOL 300 MG/ML  SOLN

[Series 2: routine abd/pel with · axial · 0.77mm/px · z∈[-1002,-602]mm · 12 of 92 slices shown, 14 images]
[im 8/92  soft-tissue]
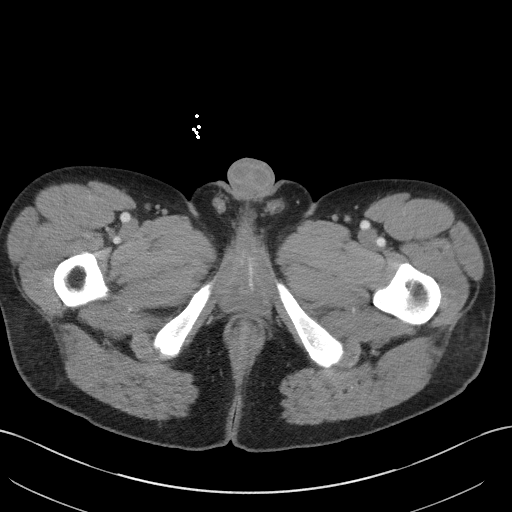
[im 8/92  bone]
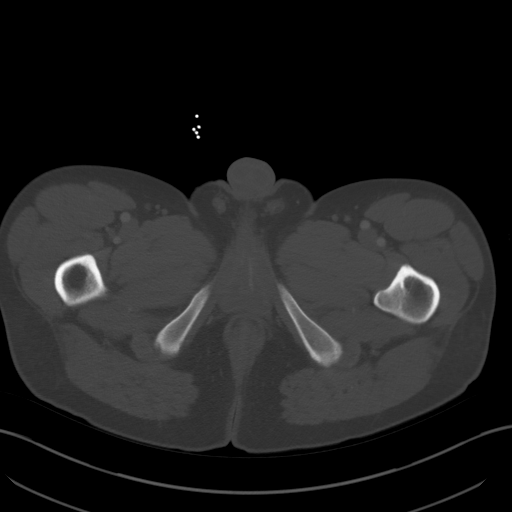
[im 15/92  soft-tissue]
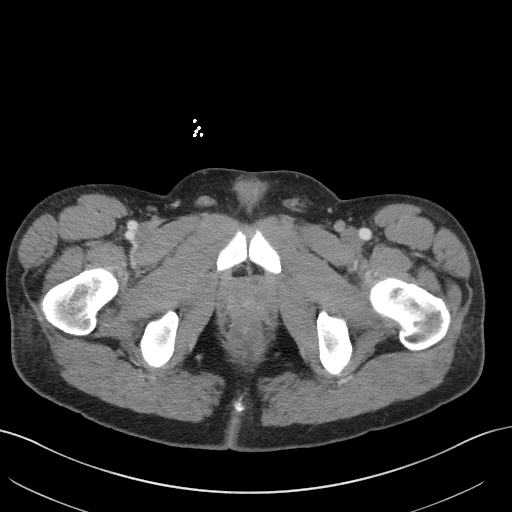
[im 22/92  soft-tissue]
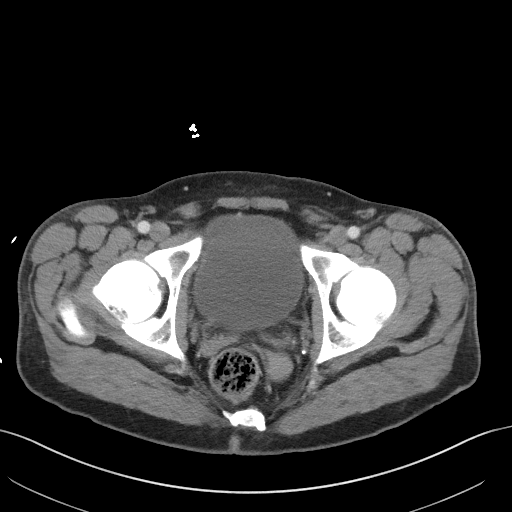
[im 30/92  soft-tissue]
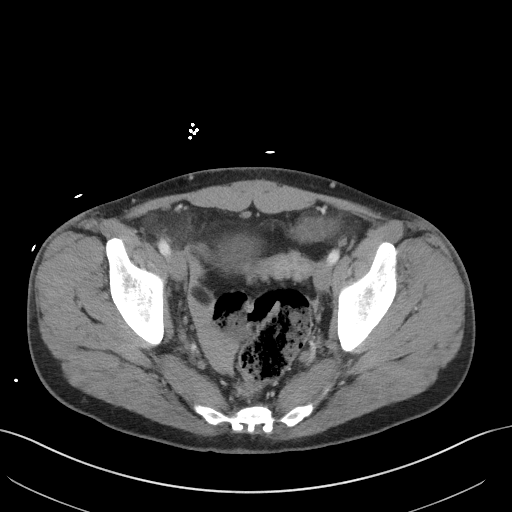
[im 37/92  soft-tissue]
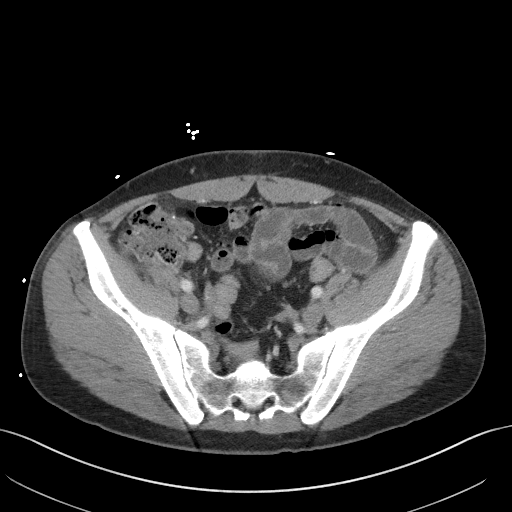
[im 44/92  soft-tissue]
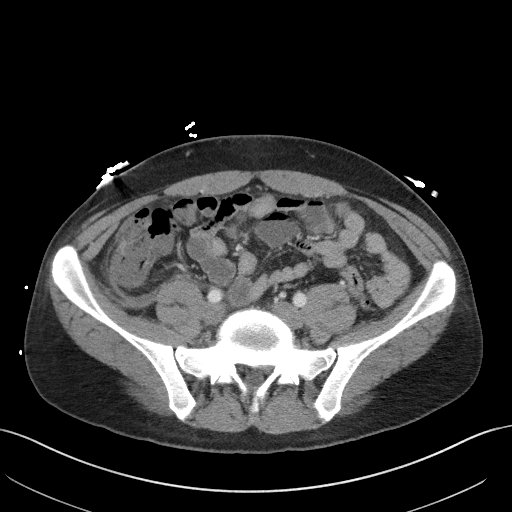
[im 51/92  soft-tissue]
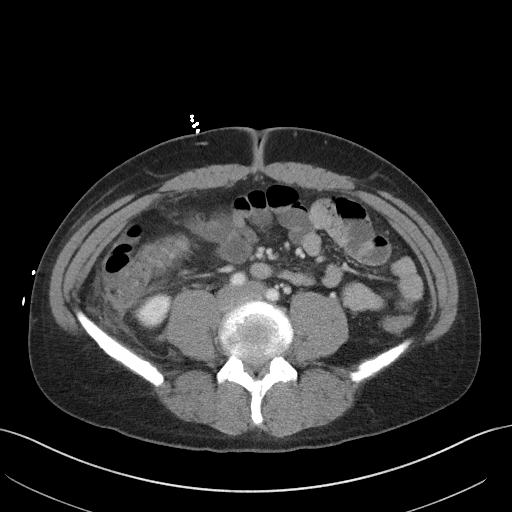
[im 59/92  soft-tissue]
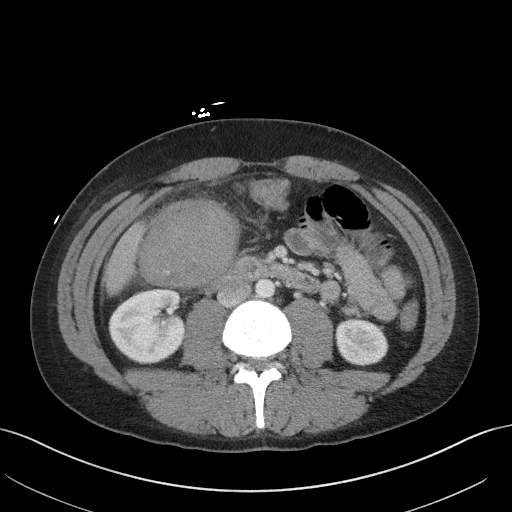
[im 66/92  soft-tissue]
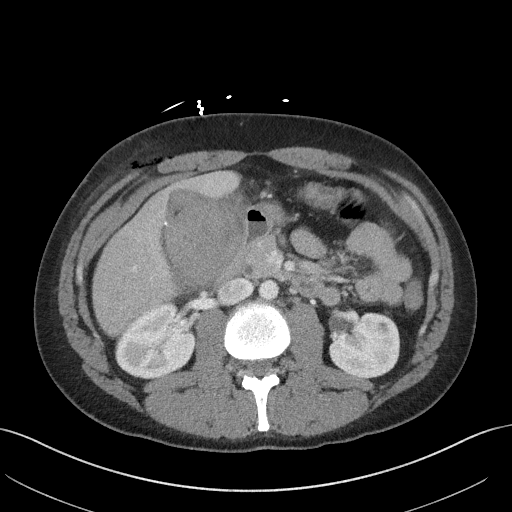
[im 66/92  bone]
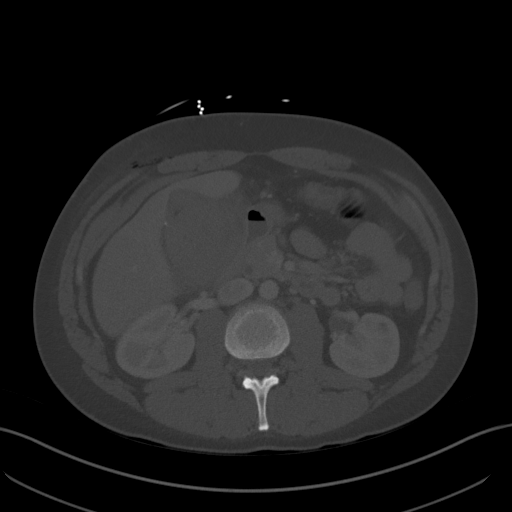
[im 73/92  soft-tissue]
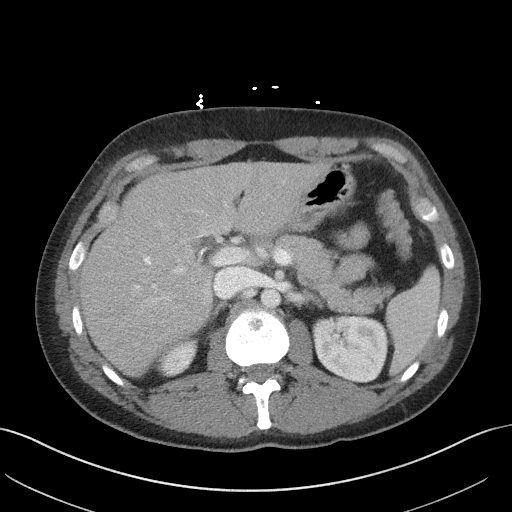
[im 81/92  soft-tissue]
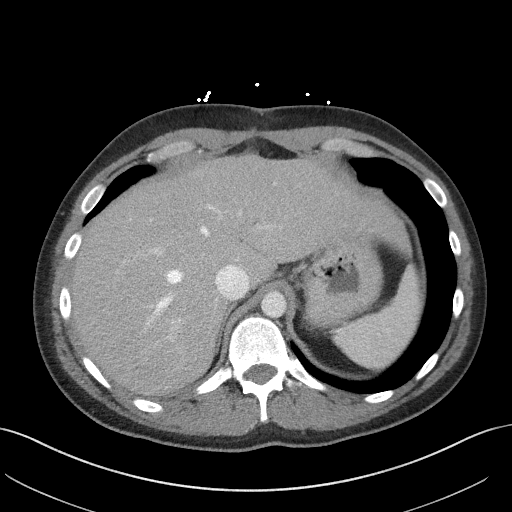
[im 88/92  soft-tissue]
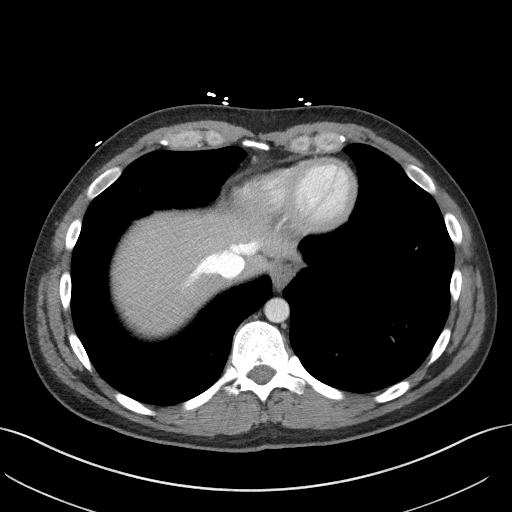

[Series 5: coronal st · coronal · 0.76mm/px · 3 of 90 slices shown]
[im 30/90  soft-tissue]
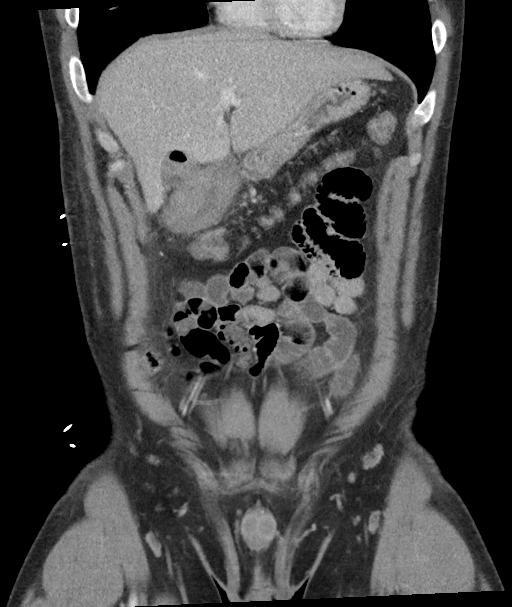
[im 40/90  soft-tissue]
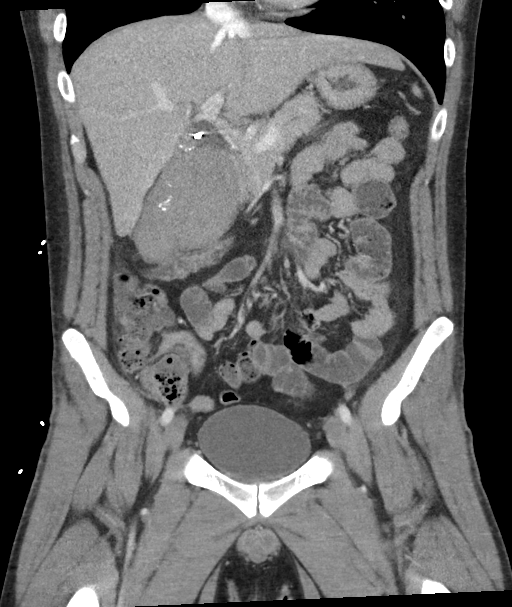
[im 50/90  soft-tissue]
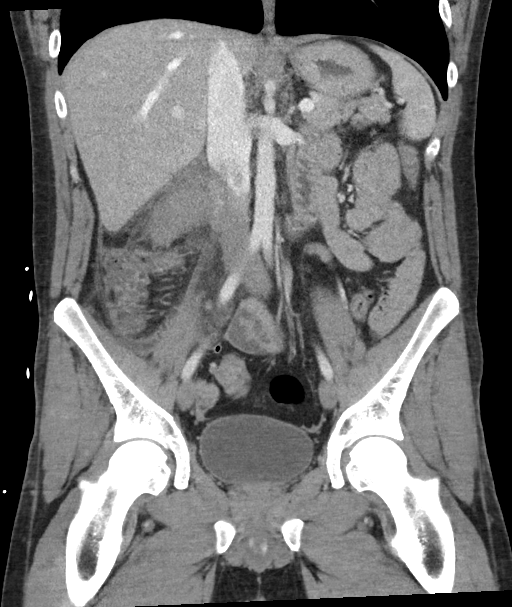

[15 of 46 positions shown; findings below may reference images not displayed]

FINDINGS: Lower chest: Unremarkable.

Hepatobiliary: No focal abnormality within the liver parenchyma.
Surgical clips are seen in the region of the distal common duct.
x 6.4 x 10.2 cm heterogeneous collection is identified in the
gallbladder fossa. Gas is evident along the nondependent portion of
this collection. There is high attenuation material in the lateral
aspect of the collection, presumably representing surgical packing
material with retained stones less likely. No intrahepatic biliary
duct dilatation. The common bile duct in the head of the pancreas is
nondilated.

Pancreas: No focal mass lesion. No dilatation of the main duct. No
intraparenchymal cyst. No peripancreatic edema.

Spleen: No splenomegaly. No focal mass lesion.

Adrenals/Urinary Tract: No adrenal nodule or mass. 7 mm
nonobstructing stone is identified in the interpolar right kidney.
Left kidney unremarkable. No evidence for hydroureter. The urinary
bladder appears normal for the degree of distention.

Stomach/Bowel: Stomach is nondistended. No gastric wall thickening.
No evidence of outlet obstruction. Mass-effect from the gallbladder
fossa collection is evident on the duodenum. No small bowel wall
thickening. No small bowel dilatation. The terminal ileum is normal.
The appendix is normal. No gross colonic mass. There is mass-effect
on the hepatic flexure and edema in the right upper quadrant omentum
and gastrocolic ligament.

Vascular/Lymphatic: No abdominal aortic aneurysm. There is no
gastrohepatic or hepatoduodenal ligament lymphadenopathy. No
intraperitoneal or retroperitoneal lymphadenopathy.. No pelvic
sidewall lymphadenopathy..

Reproductive: The prostate gland and seminal vesicles have normal
imaging features.

Other: Small volume free fluid is identified in the pelvis with
attenuation higher than would be expected for simple fluid. There is
some fluid in the right paracolic gutter in trace fluid noted along
the inferior margin of the liver.

Musculoskeletal: No worrisome lytic or sclerotic osseous
abnormality. Soft tissue attenuation in the subcutaneous fat of the
anterior abdominal wall is associated with subcutaneous gas, not
incompatible with the surgery on 06/23/2018.
IMPRESSION: 1. 7 x 6 x 10 cm heterogeneous collection in the gallbladder fossa
is most suggestive of a hematoma and superinfection cannot be
excluded by imaging. There is a small amount of gas associated with
this collection, not unexpected on postop day 7. High attenuation
debris within the collection is presumably surgical packing
material. Retained gallstones considered less likely but not
excluded.
2. Small volume high attenuation fluid in the anatomic pelvis.
Likely hemoperitoneum although infected free fluid could have this
attenuation.
3. Linear bands of soft tissue attenuation in the subcutaneous fat
of the anterior abdominal wall associated with subcutaneous gas in
the anterior abdominal wall is compatible with port placement during
surgery.

## 2019-08-10 ENCOUNTER — Ambulatory Visit: Payer: Self-pay | Admitting: Surgery
